# Patient Record
Sex: Female | Born: 1955
Health system: Southern US, Community
[De-identification: ages and names within clinical notes are randomized; demographics above are authoritative.]

## PROBLEM LIST (undated history)

## (undated) DIAGNOSIS — E785 Hyperlipidemia, unspecified: Secondary | ICD-10-CM

## (undated) DIAGNOSIS — F419 Anxiety disorder, unspecified: Secondary | ICD-10-CM

## (undated) DIAGNOSIS — I10 Essential (primary) hypertension: Secondary | ICD-10-CM

## (undated) DIAGNOSIS — F4321 Adjustment disorder with depressed mood: Secondary | ICD-10-CM

## (undated) DIAGNOSIS — K5732 Diverticulitis of large intestine without perforation or abscess without bleeding: Secondary | ICD-10-CM

## (undated) HISTORY — PX: BREAST SURGERY: SHX581

## (undated) HISTORY — DX: Diverticulitis of large intestine without perforation or abscess without bleeding: K57.32

## (undated) HISTORY — DX: Hyperlipidemia, unspecified: E78.5

## (undated) HISTORY — DX: Adjustment disorder with depressed mood: F43.21

## (undated) HISTORY — DX: Essential (primary) hypertension: I10

## (undated) HISTORY — PX: TUBAL LIGATION: SHX77

## (undated) HISTORY — DX: Anxiety disorder, unspecified: F41.9

## (undated) HISTORY — PX: JOINT REPLACEMENT: SHX530

## (undated) HISTORY — PX: BUNIONECTOMY: SHX129

---

## 2004-01-29 ENCOUNTER — Ambulatory Visit (HOSPITAL_COMMUNITY): Admission: RE | Admit: 2004-01-29 | Discharge: 2004-01-29 | Payer: Self-pay | Admitting: Family Medicine

## 2004-02-02 ENCOUNTER — Ambulatory Visit (HOSPITAL_COMMUNITY): Admission: RE | Admit: 2004-02-02 | Discharge: 2004-02-02 | Payer: Self-pay | Admitting: Family Medicine

## 2008-09-02 ENCOUNTER — Ambulatory Visit (HOSPITAL_COMMUNITY): Admission: RE | Admit: 2008-09-02 | Discharge: 2008-09-02 | Payer: Self-pay | Admitting: Family Medicine

## 2008-09-02 IMAGING — CR DG CHEST 2V
2 series · 2 of 2 positions shown · non-contrast
Comparison: None available.

CLINICAL DATA: Cough.

CHEST - 2 VIEW

[view not recorded (1 of 2)]
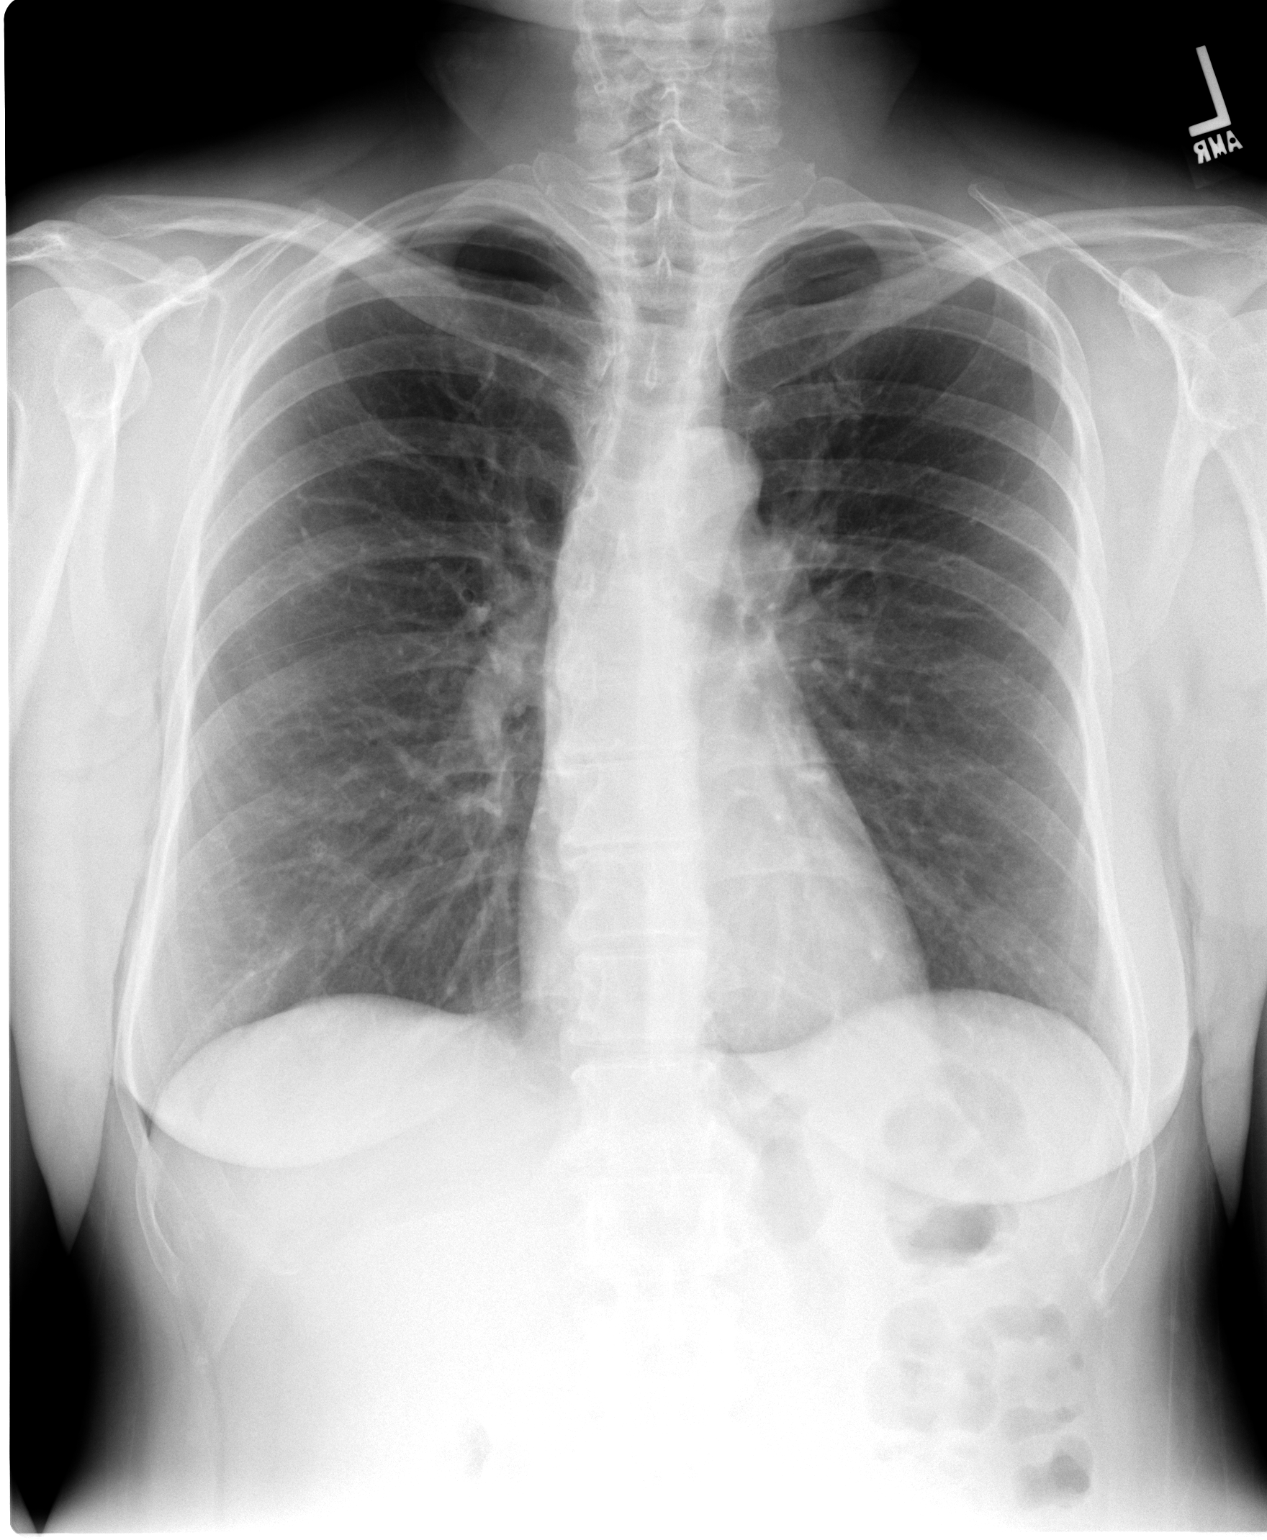

[view not recorded (2 of 2)]
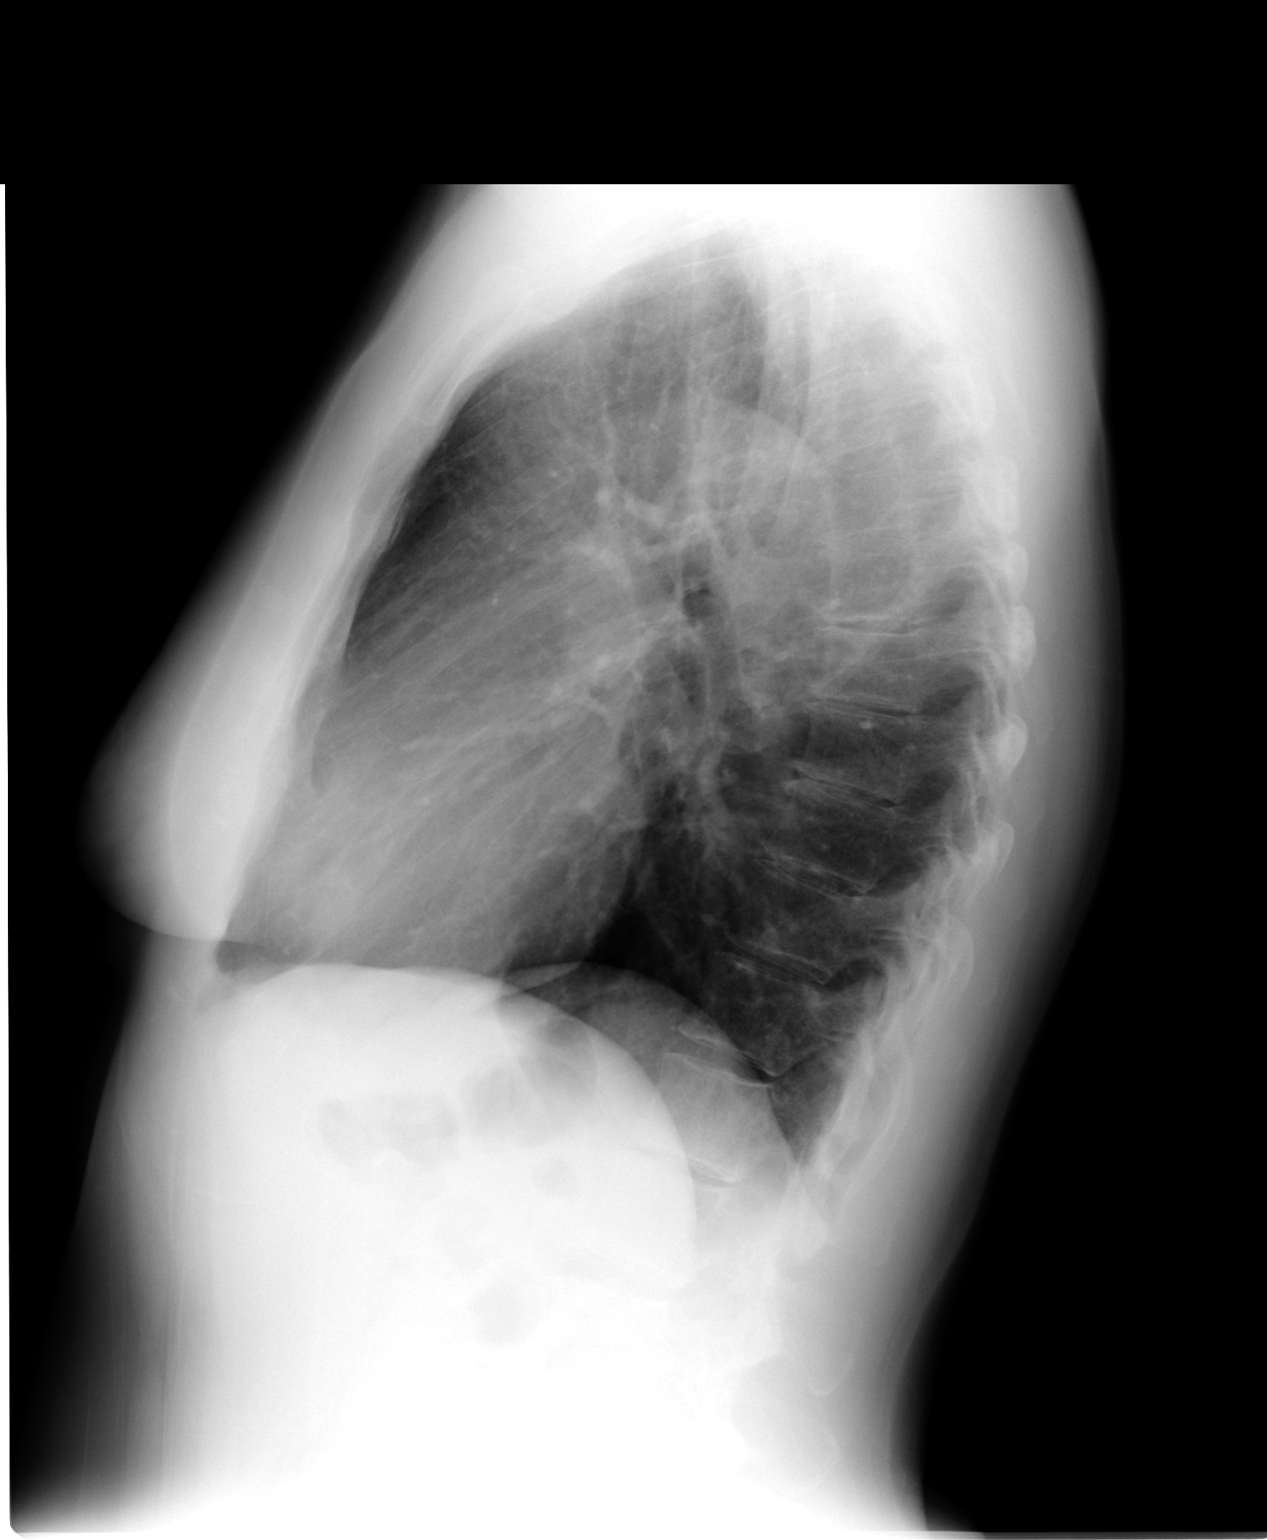

[2 of 2 positions shown; findings below may reference images not displayed]

FINDINGS: The lungs are clear.  No pleural effusion.  Heart size
normal.
IMPRESSION: No acute disease.

## 2008-09-09 ENCOUNTER — Encounter (INDEPENDENT_AMBULATORY_CARE_PROVIDER_SITE_OTHER): Payer: Self-pay | Admitting: *Deleted

## 2008-10-03 ENCOUNTER — Encounter: Payer: Self-pay | Admitting: Internal Medicine

## 2008-10-20 ENCOUNTER — Ambulatory Visit: Payer: Self-pay | Admitting: Internal Medicine

## 2008-10-20 ENCOUNTER — Encounter: Payer: Self-pay | Admitting: Internal Medicine

## 2008-10-20 ENCOUNTER — Ambulatory Visit (HOSPITAL_COMMUNITY): Admission: RE | Admit: 2008-10-20 | Discharge: 2008-10-20 | Payer: Self-pay | Admitting: Internal Medicine

## 2008-10-21 ENCOUNTER — Encounter: Payer: Self-pay | Admitting: Internal Medicine

## 2008-10-22 ENCOUNTER — Encounter: Payer: Self-pay | Admitting: Internal Medicine

## 2010-06-27 ENCOUNTER — Encounter: Payer: Self-pay | Admitting: Family Medicine

## 2010-10-19 NOTE — Op Note (Signed)
Margaret York, Margaret York                  ACCOUNT NO.:  [id number]   MEDICAL RECORD NO.:  [id number]          PATIENT TYPE:  AMB   LOCATION:  DAY                           FACILITY:  [facility]   PHYSICIAN:  R. Roetta Sessions, M.D. DATE OF BIRTH:  1956/03/09   DATE OF PROCEDURE:  10/20/2008  DATE OF DISCHARGE:                               OPERATIVE REPORT   PROCEDURE:  Colonoscopy and snare polypectomy with polyp ablation.   INDICATIONS FOR PROCEDURE:  A 55 year old lady with a positive family  history of colon cancer in father who was diagnosed around age 4.  She  never had her lower GI tract imaged.  She is here for first ever  screening colonoscopy.  She has no lower GI tract symptoms.  Colonoscopy  is now being done as screening maneuver.  Risks, benefits, alternatives  and limitations have been discussed, questions answered.  Please see  documentation in the medical record.   PROCEDURE NOTE:  O2 saturation, blood pressure, pulse on this patient  monitored throughout the entire procedure.  Conscious sedation with  Versed 5 mg IV and Demerol 100 mg IV in divided doses.  Instrument  Pentax video chip system.   FINDINGS:  Digital rectal exam revealed no abnormalities.  Endoscopic  findings:  Prep was adequate:  Colonic mucosal was surveyed from the  rectosigmoid junction through the left transverse, right colon, to the  appendiceal orifice, ileocecal valve and cecum.  These structures well  seen and photographed for the record.  From this level scope was slowly  cautiously withdrawn.  All previous mentioned mucosal surfaces were  again seen.  The patient was noted to have scattered left-sided  diverticula.  The patient had a 5-mm polyp in the distal sigmoid which  was hot snared and recovered through the scope.  There were adjacent  diminutive polyps which were ablated with the tip of hot snare cautery  unit.  The remainder of the colonic mucosa appeared normal.  Scope was  pulled down the  rectum where thorough examination of rectal mucosa  including retroflexion of anal verge demonstrated anal papilla and a  second 5-mm polyp at 5 cm which was removed with hot snare and there  were adjacent diminutive polyps which were ablated as well.  The  remainder of the rectal mucosa appeared unremarkable.  The patient  tolerated the procedure well and was reactivated in endoscopy.   IMPRESSION:  1. Anal papilla, polyps in the sigmoid and rectum resected with snare      as described above, diminutive polyps in rectum and sigmoid ablated      as described above.  2. Left-sided diverticula.  Remaining colonic mucosa appeared normal.   RECOMMENDATIONS:  1. No aspirin or __________ medications for the next 5 days.  2. Follow up diverticulosis polyp literature provided to Ms. Ronne Binning.  3. Follow-up on path.  4. Further recommendations to follow.      Jonathon Bellows, M.D.  Electronically Signed     RMR/MEDQ  D:  10/20/2008  T:  10/20/2008  Job:  [id number]   cc:  Bonne Dolores, M.D.  Fax: [phone]

## 2010-11-24 ENCOUNTER — Observation Stay (HOSPITAL_COMMUNITY)
Admission: EM | Admit: 2010-11-24 | Discharge: 2010-11-26 | Disposition: A | Discharge status: Other Acute Inpatient Hospital | Payer: Managed Care, Other (non HMO) | Attending: Cardiology | Admitting: Cardiology

## 2010-11-24 ENCOUNTER — Emergency Department (HOSPITAL_COMMUNITY): Payer: Managed Care, Other (non HMO)

## 2010-11-24 ENCOUNTER — Emergency Department (HOSPITAL_COMMUNITY)
Admission: EM | Admit: 2010-11-24 | Discharge: 2010-11-24 | Disposition: A | Discharge status: Other Acute Inpatient Hospital | Payer: Managed Care, Other (non HMO) | Source: Home / Self Care | Attending: Emergency Medicine | Admitting: Emergency Medicine

## 2010-11-24 DIAGNOSIS — R072 Precordial pain: Principal | ICD-10-CM | POA: Insufficient documentation

## 2010-11-24 DIAGNOSIS — E785 Hyperlipidemia, unspecified: Secondary | ICD-10-CM | POA: Insufficient documentation

## 2010-11-24 DIAGNOSIS — R509 Fever, unspecified: Secondary | ICD-10-CM | POA: Insufficient documentation

## 2010-11-24 DIAGNOSIS — R079 Chest pain, unspecified: Secondary | ICD-10-CM | POA: Insufficient documentation

## 2010-11-24 DIAGNOSIS — F17200 Nicotine dependence, unspecified, uncomplicated: Secondary | ICD-10-CM | POA: Insufficient documentation

## 2010-11-24 LAB — COMPREHENSIVE METABOLIC PANEL
ALT: 14 U/L (ref 0–35)
Albumin: 4 g/dL (ref 3.5–5.2)
Alkaline Phosphatase: 68 U/L (ref 39–117)
BUN: 15 mg/dL (ref 6–23)
CO2: 29 mEq/L (ref 19–32)
Chloride: 99 mEq/L (ref 96–112)
GFR calc Af Amer: >60 mL/min (ref >60)
GFR calc non Af Amer: >60 mL/min (ref >60)

## 2010-11-24 LAB — CBC
HCT: 34.6 % — ABNORMAL LOW (ref 36.0–46.0)
MCH: 32.3 pg (ref 26.0–34.0)
MCHC: 34.7 g/dL (ref 30.0–36.0)
RBC: 3.71 MIL/uL — ABNORMAL LOW (ref 3.87–5.11)
RDW: 12.6 % (ref 11.5–15.5)

## 2010-11-24 LAB — CK TOTAL AND CKMB (NOT AT ARMC): Relative Index: 1.9 (ref 0.0–2.5)

## 2010-11-25 LAB — LIPID PANEL
Cholesterol: 199 mg/dL (ref 0–200)
Total CHOL/HDL Ratio: 6.4 RATIO
VLDL: 29 mg/dL (ref 0–40)

## 2010-11-25 LAB — MAGNESIUM: Magnesium: 1.9 mg/dL (ref 1.5–2.5)

## 2010-11-25 LAB — CARDIAC PANEL(CRET KIN+CKTOT+MB+TROPI)
Relative Index: 2.2 (ref 0.0–2.5)
Relative Index: 2.3 (ref 0.0–2.5)
Total CK: 100 U/L (ref 7–177)

## 2010-11-25 LAB — T4, FREE: Free T4: 1.06 ng/dL (ref 0.80–1.80)

## 2010-11-25 LAB — HEMOGLOBIN A1C
Hgb A1c MFr Bld: 5.8 % — ABNORMAL HIGH (ref <5.7)
Mean Plasma Glucose: 120 mg/dL — ABNORMAL HIGH (ref <117)

## 2010-11-26 ENCOUNTER — Inpatient Hospital Stay (HOSPITAL_COMMUNITY): Payer: Managed Care, Other (non HMO)

## 2010-11-26 ENCOUNTER — Other Ambulatory Visit (HOSPITAL_COMMUNITY): Payer: Managed Care, Other (non HMO)

## 2010-11-26 MED ORDER — TECHNETIUM TC 99M TETROFOSMIN IV KIT
10.0000 | PACK | Freq: Once | INTRAVENOUS | Status: AC | PRN
  Administered 2010-11-26: 10 via INTRAVENOUS

## 2010-11-26 MED ORDER — TECHNETIUM TC 99M TETROFOSMIN IV KIT
30.0000 | PACK | Freq: Once | INTRAVENOUS | Status: AC | PRN
  Administered 2010-11-26: 30 via INTRAVENOUS

## 2010-11-27 ENCOUNTER — Other Ambulatory Visit (HOSPITAL_COMMUNITY): Payer: Managed Care, Other (non HMO)

## 2010-12-02 NOTE — Discharge Summary (Signed)
Margaret York, Margaret York NO.:  [id number]  MEDICAL RECORD NO.:  [id number]  LOCATION:  4706                         FACILITY:  [facility]  PHYSICIAN:  Landry Corporal, MD DATE OF BIRTH:  [birth year]  DATE OF ADMISSION:  11/24/2010 DATE OF DISCHARGE:  11/26/2010                              DISCHARGE SUMMARY   DISCHARGE DIAGNOSES: 1. Chest pain.  Cardiac enzymes negative x3, negative nuclear stress     test at this admission. 2. Tobacco abuse. 3. Dyslipidemia. 4. Positive family history of coronary artery disease.  HOSPITAL COURSE:  Margaret York is a 56 year old Caucasian female who initially presents to her PCP with persistent nonexertional mid sternal chest pain of 2 weeks duration.  Chest pain was exacerbated at times with mental stress and relieved with rest and relaxation.  She was admitted to rule out acute coronary artery syndrome, 2-D echocardiogram was ordered.  Cardiac enzymes were cycled x3.  Myoview stress test was ordered as well as lipid panel, hemoglobin A1c TSH.  The patient was started on nitroglycerin patch as well as Lovenox.  On the subsequent day, she had no complaints of chest pain or shortness of breath. Cardiac enzymes were negative x3.  She was given Tylenol for headache. On November 26, 2010, she complains of sharp little chest pain.  Nuclear stress test was completed, was negative for inducible ischemia.  A 2-D echocardiogram showed ejection fraction in the range of 65% to 70%, normal wall motion, no loss of wall motion abnormalities and left ventricle, mild LVH, normal cavity size.  Pulmonary artery pressure. The patient has been seen by Dr. Herbie Baltimore to see if she is stable for discharge home and follow up with Dr. Herbie Baltimore.  DISCHARGE LABS:  WBC 7.8, hemoglobin 12.0, hematocrit 34.6, platelets 228.  Pro-time is 13.5, INR is 1.01.  Sodium 135, potassium 3.9, chloride 99, carbon dioxide 29, glucose 85, BUN 15, creatinine 0.62, total  bilirubin 0.6, alkaline phosphatase 68, AST 18, ALT 14, total protein 6.6, albumin 4.0, calcium 9.4, magnesium 9.1, hemoglobin A1c is 5.8, total cholesterol 199, triglycerides 147, HDL 31, LDL 139, VLDL 29, total cholesterol HDL ratio is 6.4, free T4 is 1.06.  Thyroid stimulating hormone of 1.487.  BNP was 85.4.  STUDIES/PROCEDURES: 1. A 2-D echocardiogram, left ventricle cavity size is normal, wall     thickness was increased with mild LVH, no wall motion     abnormalities, ejection fraction 65% to 70%.  Doppler parameters     consistent with high ventricular filling pressure.  Trivial     regurgitation in the aortic valve.  Normal pulmonary artery     pressure. 2. Nuclear stress test, impression normal nuclear medicine cardiac     perfusion scan.  Ejection fraction of 71%, normal wall motion noted     evidence of reversibility or significant scar. 3. Chest x-ray on November 24, 2010 shows no acute cardiopulmonary     abnormality.  Cardiac size within normal limits.  DISCHARGE MEDICATIONS: 1. Acetaminophen 325 mg 2 tablets by mouth every 6 hours as needed for     pain. 2. Maalox suspension 15 to 30 mL, recharge as needed. 3. Nitroglycerin sublingual 0.4 mg 1 tablet  on tongue every 5 minutes     of three doses total for chest pain. 4. Pantoprazole 40 mg enteric-coated 1 tablet by mouth daily. 5. Rosuvastatin 20 mg 1 tablet by mouth daily. 6. Aspirin enteric coated 81 mg one tab by mouth daily. 7. Biotin 1000 mcg 1 tablet by mouth twice daily. 8. Multivitamin 1 tablet by mouth daily. 9. Omega-3 acid 1 g 2 tablets by mouth twice a day. 10.St. John's Wort 300 mg 2 tablets mouth twice daily. 11.B complex over-the-counter 1 tablet by mouth daily.  DISPOSITION:  The patient is discharged home in stable condition.  I recommend she eat heart-healthy diet and is recommended she will followup with Dr. Phillips Odor.    ______________________________ Wilburt Finlay,  PA   ______________________________ Landry Corporal, MD    BH/MEDQ  D:  11/26/2010  T:  11/26/2010  Job:  [id number]  Electronically Signed by Wilburt Finlay PA on 11/29/2010 11:57:33 AM Electronically Signed by Bryan Lemma MD on 12/02/2010 07:59:29 PM

## 2013-04-17 ENCOUNTER — Other Ambulatory Visit (HOSPITAL_COMMUNITY): Payer: Self-pay | Admitting: Family Medicine

## 2013-04-17 ENCOUNTER — Ambulatory Visit (HOSPITAL_COMMUNITY)
Admission: RE | Admit: 2013-04-17 | Discharge: 2013-04-17 | Disposition: A | Discharge status: Home / Self Care | Payer: Managed Care, Other (non HMO) | Source: Ambulatory Visit | Attending: Family Medicine | Admitting: Family Medicine

## 2013-04-17 DIAGNOSIS — F17200 Nicotine dependence, unspecified, uncomplicated: Secondary | ICD-10-CM

## 2013-04-17 DIAGNOSIS — Z0000 Encounter for general adult medical examination without abnormal findings: Secondary | ICD-10-CM

## 2013-04-17 DIAGNOSIS — R05 Cough: Secondary | ICD-10-CM

## 2013-04-17 DIAGNOSIS — Z139 Encounter for screening, unspecified: Secondary | ICD-10-CM

## 2013-04-17 DIAGNOSIS — R059 Cough, unspecified: Secondary | ICD-10-CM

## 2013-04-17 DIAGNOSIS — Z87891 Personal history of nicotine dependence: Secondary | ICD-10-CM | POA: Insufficient documentation

## 2013-04-17 DIAGNOSIS — E785 Hyperlipidemia, unspecified: Secondary | ICD-10-CM

## 2013-04-19 ENCOUNTER — Ambulatory Visit (HOSPITAL_COMMUNITY)
Admission: RE | Admit: 2013-04-19 | Discharge: 2013-04-19 | Disposition: A | Discharge status: Home / Self Care | Payer: Managed Care, Other (non HMO) | Source: Ambulatory Visit | Attending: Family Medicine | Admitting: Family Medicine

## 2013-04-19 DIAGNOSIS — Z780 Asymptomatic menopausal state: Secondary | ICD-10-CM | POA: Insufficient documentation

## 2013-04-19 DIAGNOSIS — Z0000 Encounter for general adult medical examination without abnormal findings: Secondary | ICD-10-CM

## 2013-04-19 DIAGNOSIS — M899 Disorder of bone, unspecified: Secondary | ICD-10-CM | POA: Insufficient documentation

## 2013-04-19 DIAGNOSIS — Z139 Encounter for screening, unspecified: Secondary | ICD-10-CM

## 2020-01-28 DIAGNOSIS — Z6829 Body mass index (BMI) 29.0-29.9, adult: Secondary | ICD-10-CM | POA: Diagnosis not present

## 2020-01-28 DIAGNOSIS — M25562 Pain in left knee: Secondary | ICD-10-CM | POA: Diagnosis not present

## 2020-01-28 DIAGNOSIS — W19XXXA Unspecified fall, initial encounter: Secondary | ICD-10-CM | POA: Diagnosis not present

## 2020-01-28 DIAGNOSIS — S82832A Other fracture of upper and lower end of left fibula, initial encounter for closed fracture: Secondary | ICD-10-CM | POA: Diagnosis not present

## 2020-01-30 DIAGNOSIS — S82832A Other fracture of upper and lower end of left fibula, initial encounter for closed fracture: Secondary | ICD-10-CM | POA: Diagnosis not present

## 2020-01-30 DIAGNOSIS — Z6829 Body mass index (BMI) 29.0-29.9, adult: Secondary | ICD-10-CM | POA: Diagnosis not present

## 2020-02-19 DIAGNOSIS — Z6830 Body mass index (BMI) 30.0-30.9, adult: Secondary | ICD-10-CM | POA: Diagnosis not present

## 2020-02-19 DIAGNOSIS — Z1389 Encounter for screening for other disorder: Secondary | ICD-10-CM | POA: Diagnosis not present

## 2020-02-19 DIAGNOSIS — Z0000 Encounter for general adult medical examination without abnormal findings: Secondary | ICD-10-CM | POA: Diagnosis not present

## 2020-02-19 DIAGNOSIS — E6609 Other obesity due to excess calories: Secondary | ICD-10-CM | POA: Diagnosis not present

## 2020-06-29 DIAGNOSIS — K047 Periapical abscess without sinus: Secondary | ICD-10-CM | POA: Diagnosis not present

## 2020-07-28 DIAGNOSIS — Z6828 Body mass index (BMI) 28.0-28.9, adult: Secondary | ICD-10-CM | POA: Diagnosis not present

## 2020-07-28 DIAGNOSIS — Z01419 Encounter for gynecological examination (general) (routine) without abnormal findings: Secondary | ICD-10-CM | POA: Diagnosis not present

## 2020-10-01 DIAGNOSIS — H01004 Unspecified blepharitis left upper eyelid: Secondary | ICD-10-CM | POA: Diagnosis not present

## 2020-10-01 DIAGNOSIS — H2513 Age-related nuclear cataract, bilateral: Secondary | ICD-10-CM | POA: Diagnosis not present

## 2020-10-01 DIAGNOSIS — H01001 Unspecified blepharitis right upper eyelid: Secondary | ICD-10-CM | POA: Diagnosis not present

## 2020-10-01 DIAGNOSIS — H01002 Unspecified blepharitis right lower eyelid: Secondary | ICD-10-CM | POA: Diagnosis not present

## 2020-10-22 DIAGNOSIS — Z1231 Encounter for screening mammogram for malignant neoplasm of breast: Secondary | ICD-10-CM | POA: Diagnosis not present

## 2020-11-03 ENCOUNTER — Encounter (HOSPITAL_COMMUNITY)
Admission: RE | Admit: 2020-11-03 | Discharge: 2020-11-03 | Disposition: A | Discharge status: Home / Self Care | Payer: Medicare Other | Source: Ambulatory Visit | Attending: Ophthalmology | Admitting: Ophthalmology

## 2020-11-03 ENCOUNTER — Encounter (HOSPITAL_COMMUNITY): Payer: Self-pay

## 2020-11-03 ENCOUNTER — Other Ambulatory Visit: Payer: Self-pay

## 2020-11-03 NOTE — H&P (Signed)
Surgical History & Physical  Patient Name: Margaret York DOB: 1956-03-04  Surgery: Cataract extraction with intraocular lens implant phacoemulsification; Left Eye  Surgeon: Margaret Pierce MD Surgery Date:  11/09/2020 Pre-Op Date:  11/03/2020  HPI: [name] saw Dr Margaret York, was told she had cataracts. 1. The patient complains of difficulty when driving, which began many years ago. Both eyes are affected. The episode is gradual. The condition's severity increased since last visit. Symptoms occur when the patient is driving, inside and outside. This is negatively affecting her quality of life. HPI Completed by Dr. Fabio York  Medical History: Cataracts Seasonal Allergies  Review of Systems Negative Allergic/Immunologic Negative Cardiovascular Negative Constitutional Negative Ear, Nose, Mouth & Throat Negative Endocrine Negative Eyes Negative Gastrointestinal Negative Genitourinary Negative Hemotologic/Lymphatic Negative Integumentary Negative Musculoskeletal Negative Neurological Negative Psychiatry Negative Respiratory  Social   Never smoked   Medication Vitamins, Aspirin, Collagen+Vit C, Fish Oil,   Sx/Procedures Toe Joint Surgery, Tubal Ligation, Breast cyst removals,   Drug Allergies  BACTRIM, Codeine, Zyban,   History & Physical: Heent:  Cataract, Left eye NECK: supple without bruits LUNGS: lungs clear to auscultation CV: regular rate and rhythm Abdomen: soft and non-tender  Impression & Plan: Assessment: 1.  NUCLEAR SCLEROSIS AGE RELATED; Both Eyes (H25.13) 2.  BLEPHARITIS; Right Upper Lid, Right Lower Lid, Left Upper Lid, Left Lower Lid (H01.001, H01.002,H01.004,H01.005) 3.  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  OAG BORDERLINE FINDINGS LOW RISK; Both Eyes (H40.013) 5.  ASTIGMATISM, REGULAR; Both Eyes (H52.223)  Plan: 1.  Cataract accounts for the patient's decreased vision. This visual impairment is not  correctable with a tolerable change in glasses or contact lenses. Cataract surgery with an implantation of a new lens should significantly improve the visual and functional status of the patient. Discussed all risks, benefits, alternatives, and potential complications. Discussed the procedures and recovery. Patient desires to have surgery. A-scan ordered and performed today for intra-ocular lens calculations. The surgery will be performed in order to improve vision for driving, reading, and for eye examinations. Recommend phacoemulsification with intra-ocular lens. Recommend Dextenza for post-operative pain and inflammation. Left Eye worse - first. Dilates well - shugarcaine by protocol. Toric Lens. 2.  Recommend regular lid cleaning. 3.  Chronic. Artificial tears 1 drop 2-3x/day. 4.  Based on cup-to-disc ratio. Positive Family history. OCT rNFL shows: WNL OU. Detailed discussion about glaucoma today including importance of maintaining good follow up and following treatment plan, and the possibility of irreversible blindness as part of this disease process. Positive family history. 5.  Recommend Toric IOL as above.

## 2020-11-06 ENCOUNTER — Other Ambulatory Visit (HOSPITAL_COMMUNITY): Payer: Self-pay

## 2020-11-09 NOTE — OR Nursing (Signed)
Procedure canceled ,  Dr. Beather Arbour sick, patient notified

## 2020-12-25 MED ORDER — TETRACAINE HCL 0.5 % OP SOLN: 1.0000 [drp] | OPHTHALMIC | Status: DC | PRN

## 2020-12-25 MED ORDER — PHENYLEPHRINE HCL 2.5 % OP SOLN: 1.0000 [drp] | OPHTHALMIC | Status: DC | PRN

## 2020-12-28 ENCOUNTER — Encounter (HOSPITAL_COMMUNITY)
Admission: RE | Admit: 2020-12-28 | Discharge: 2020-12-28 | Disposition: A | Discharge status: Home / Self Care | Payer: Medicare Other | Source: Ambulatory Visit | Attending: Ophthalmology | Admitting: Ophthalmology

## 2020-12-28 ENCOUNTER — Encounter (HOSPITAL_COMMUNITY): Payer: Self-pay

## 2020-12-28 ENCOUNTER — Other Ambulatory Visit: Payer: Self-pay

## 2020-12-28 DIAGNOSIS — H2512 Age-related nuclear cataract, left eye: Secondary | ICD-10-CM | POA: Diagnosis not present

## 2020-12-28 NOTE — H&P (Signed)
Surgical History & Physical  Patient Name: Margaret York DOB: 09-07-1955  Surgery: Cataract extraction with intraocular lens implant phacoemulsification; Left Eye  Surgeon: Margaret Pierce MD Surgery Date:  01/01/2021 Pre-Op Date:  12/28/2020  HPI: [name] saw Dr Margaret York, was told she had cataracts. 1. The patient complains of difficulty when driving, which began many years ago. Both eyes are affected. The episode is gradual. The condition's severity increased since last visit. Symptoms occur when the patient is driving, inside and outside. This is negatively affecting her quality of life. HPI Completed by Dr. Fabio York  Medical History: Cataracts Seasonal Allergies  Review of Systems Negative Allergic/Immunologic Negative Cardiovascular Negative Constitutional Negative Ear, Nose, Mouth & Throat Negative Endocrine Negative Eyes Negative Gastrointestinal Negative Genitourinary Negative Hemotologic/Lymphatic Negative Integumentary Negative Musculoskeletal Negative Neurological Negative Psychiatry Negative Respiratory  Social   Never smoked   Medication Vitamins, Aspirin, Collagen+Vit C, Fish Oil,   Sx/Procedures Toe Joint Surgery, Tubal Ligation, Breast cyst removals,   Drug Allergies  BACTRIM, Codeine, Zyban,   History & Physical: Heent:  Cataract, Left eye NECK: supple without bruits LUNGS: lungs clear to auscultation CV: regular rate and rhythm Abdomen: soft and non-tender  Impression & Plan: Assessment: 1.  NUCLEAR SCLEROSIS AGE RELATED; Both Eyes (H25.13) 2.  BLEPHARITIS; Right Upper Lid, Right Lower Lid, Left Upper Lid, Left Lower Lid (H01.001, H01.002,H01.004,H01.005) 3.  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  OAG BORDERLINE FINDINGS LOW RISK; Both Eyes (H40.013) 5.  ASTIGMATISM, REGULAR; Both Eyes (H52.223)  Plan: 1.  Cataract accounts for the patient's decreased vision. This visual impairment is not  correctable with a tolerable change in glasses or contact lenses. Cataract surgery with an implantation of a new lens should significantly improve the visual and functional status of the patient. Discussed all risks, benefits, alternatives, and potential complications. Discussed the procedures and recovery. Patient desires to have surgery. A-scan ordered and performed today for intra-ocular lens calculations. The surgery will be performed in order to improve vision for driving, reading, and for eye examinations. Recommend phacoemulsification with intra-ocular lens. Recommend Dextenza for post-operative pain and inflammation. Left Eye worse - first. Dilates well - shugarcaine by protocol. Toric Lens.  2.  Recommend regular lid cleaning.  3.  Chronic. Artificial tears 1 drop 2-3x/day.  4.  Based on cup-to-disc ratio. Positive Family history. OCT rNFL shows: WNL OU. Detailed discussion about glaucoma today including importance of maintaining good follow up and following treatment plan, and the possibility of irreversible blindness as part of this disease process. Positive family history.  5.  Recommend Toric IOL as above.

## 2021-01-01 ENCOUNTER — Encounter (HOSPITAL_COMMUNITY): Payer: Self-pay | Admitting: Ophthalmology

## 2021-01-01 ENCOUNTER — Ambulatory Visit (HOSPITAL_COMMUNITY): Payer: Medicare Other | Admitting: Anesthesiology

## 2021-01-01 ENCOUNTER — Ambulatory Visit (HOSPITAL_COMMUNITY)
Admission: RE | Admit: 2021-01-01 | Discharge: 2021-01-01 | Disposition: A | Discharge status: Home / Self Care | Payer: Medicare Other | Attending: Ophthalmology | Admitting: Ophthalmology

## 2021-01-01 ENCOUNTER — Encounter (HOSPITAL_COMMUNITY)
Admission: RE | Disposition: A | Discharge status: Home / Self Care | Payer: Self-pay | Source: Home / Self Care | Attending: Ophthalmology

## 2021-01-01 DIAGNOSIS — H52223 Regular astigmatism, bilateral: Secondary | ICD-10-CM | POA: Insufficient documentation

## 2021-01-01 DIAGNOSIS — H2512 Age-related nuclear cataract, left eye: Secondary | ICD-10-CM | POA: Diagnosis not present

## 2021-01-01 DIAGNOSIS — Z885 Allergy status to narcotic agent status: Secondary | ICD-10-CM | POA: Insufficient documentation

## 2021-01-01 DIAGNOSIS — Z881 Allergy status to other antibiotic agents status: Secondary | ICD-10-CM | POA: Diagnosis not present

## 2021-01-01 DIAGNOSIS — F17210 Nicotine dependence, cigarettes, uncomplicated: Secondary | ICD-10-CM | POA: Diagnosis not present

## 2021-01-01 HISTORY — PX: CATARACT EXTRACTION W/PHACO: SHX586

## 2021-01-01 SURGERY — PHACOEMULSIFICATION, CATARACT, WITH IOL INSERTION
Anesthesia: Monitor Anesthesia Care | Site: Eye | Laterality: Left

## 2021-01-01 MED ORDER — EPINEPHRINE PF 1 MG/ML IJ SOLN
INTRAOCULAR | Status: DC | PRN
  Administered 2021-01-01: 500 mL

## 2021-01-01 MED ORDER — SODIUM HYALURONATE 10 MG/ML IO SOLUTION
PREFILLED_SYRINGE | INTRAOCULAR | Status: DC | PRN
  Administered 2021-01-01: 0.85 mL via INTRAOCULAR

## 2021-01-01 MED ORDER — POVIDONE-IODINE 5 % OP SOLN
OPHTHALMIC | Status: DC | PRN
  Administered 2021-01-01: 1 via OPHTHALMIC

## 2021-01-01 MED ORDER — TETRACAINE HCL 0.5 % OP SOLN
1.0000 [drp] | OPHTHALMIC | Status: AC | PRN
  Administered 2021-01-01 (×3): 1 [drp] via OPHTHALMIC

## 2021-01-01 MED ORDER — STERILE WATER FOR IRRIGATION IR SOLN
Status: DC | PRN
  Administered 2021-01-01: 250 mL

## 2021-01-01 MED ORDER — TROPICAMIDE 1 % OP SOLN
1.0000 [drp] | OPHTHALMIC | Status: AC
  Administered 2021-01-01 (×3): 1 [drp] via OPHTHALMIC

## 2021-01-01 MED ORDER — LIDOCAINE HCL (PF) 1 % IJ SOLN
INTRAOCULAR | Status: DC | PRN
  Administered 2021-01-01: 1 mL via OPHTHALMIC

## 2021-01-01 MED ORDER — PHENYLEPHRINE HCL 2.5 % OP SOLN
1.0000 [drp] | OPHTHALMIC | Status: AC | PRN
  Administered 2021-01-01 (×3): 1 [drp] via OPHTHALMIC

## 2021-01-01 MED ORDER — LIDOCAINE HCL 3.5 % OP GEL
1.0000 "application " | Freq: Once | OPHTHALMIC | Status: AC
  Administered 2021-01-01: 1 via OPHTHALMIC

## 2021-01-01 MED ORDER — BSS IO SOLN
INTRAOCULAR | Status: DC | PRN
  Administered 2021-01-01: 15 mL via INTRAOCULAR

## 2021-01-01 MED ORDER — SODIUM HYALURONATE 23MG/ML IO SOSY
PREFILLED_SYRINGE | INTRAOCULAR | Status: DC | PRN
  Administered 2021-01-01: 0.6 mL via INTRAOCULAR

## 2021-01-01 SURGICAL SUPPLY — 11 items
CLOTH BEACON ORANGE TIMEOUT ST (SAFETY) ×1 IMPLANT
EYE SHIELD UNIVERSAL CLEAR (GAUZE/BANDAGES/DRESSINGS) ×1 IMPLANT
GLOVE SURG UNDER POLY LF SZ7 (GLOVE) ×2 IMPLANT
NDL HYPO 18GX1.5 BLUNT FILL (NEEDLE) IMPLANT
NEEDLE HYPO 18GX1.5 BLUNT FILL (NEEDLE) ×2 IMPLANT
PAD ARMBOARD 7.5X6 YLW CONV (MISCELLANEOUS) ×1 IMPLANT
SYR TB 1ML LL NO SAFETY (SYRINGE) ×1 IMPLANT
TAPE SURG TRANSPORE 1 IN (GAUZE/BANDAGES/DRESSINGS) IMPLANT
TAPE SURGICAL TRANSPORE 1 IN (GAUZE/BANDAGES/DRESSINGS) ×2
TECHNIS 1-PIECE IOL (Intraocular Lens) ×1 IMPLANT
WATER STERILE IRR 250ML POUR (IV SOLUTION) ×1 IMPLANT

## 2021-01-01 NOTE — Discharge Instructions (Signed)
Please discharge patient when stable, will follow up today with Dr. Mckaela Howley at the [facility] [place] office immediately following discharge.  Leave shield in place until visit.  All paperwork with discharge instructions will be given at the office.  [facility] [place] Address:  [address]  [place], [place] [address]  

## 2021-01-01 NOTE — Op Note (Signed)
Date of procedure: 01/01/21  Pre-operative diagnosis: Visually significant age-related nuclear cataract, Left Eye (H25.12)  Post-operative diagnosis: Visually significant age-related nuclear cataract, Left Eye  Procedure: Removal of cataract via phacoemulsification and insertion of intra-ocular lens Wynetta Emery and [place]  +16.5D into the capsular bag of the Left Eye  Attending surgeon: Gerda Diss. Kortney Potvin, MD, MA  Anesthesia: MAC, Topical Akten  Complications: None  Estimated Blood Loss: <63m (minimal)  Specimens: None  Implants: As above  Indications:  Visually significant age-related cataract, Left Eye  Procedure:  The patient was seen and identified in the pre-operative area. The operative eye was identified and dilated.  The operative eye was marked.  Topical anesthesia was administered to the operative eye.     The patient was then to the operative suite and placed in the supine position.  A timeout was performed confirming the patient, procedure to be performed, and all other relevant information.   The patient's face was prepped and draped in the usual fashion for intra-ocular surgery.  A lid speculum was placed into the operative eye and the surgical microscope moved into place and focused.  An inferotemporal paracentesis was created using a 20 gauge paracentesis blade.  Shugarcaine was injected into the anterior chamber.  Viscoelastic was injected into the anterior chamber.  A temporal clear-corneal main wound incision was created using a 2.473mmicrokeratome.  A continuous curvilinear capsulorrhexis was initiated using an irrigating cystitome and completed using capsulorrhexis forceps.  Hydrodissection and hydrodeliniation were performed.  Viscoelastic was injected into the anterior chamber.  A phacoemulsification handpiece and a chopper as a second instrument were used to remove the nucleus and epinucleus. The irrigation/aspiration handpiece was used to remove any remaining  cortical material.   The capsular bag was reinflated with viscoelastic, checked, and found to be intact.  The intraocular lens was inserted into the capsular bag.  The irrigation/aspiration handpiece was used to remove any remaining viscoelastic.  The clear corneal wound and paracentesis wounds were then hydrated and checked with Weck-Cels to be watertight.  The lid-speculum and drape was removed, and the patient's face was cleaned with a wet and dry 4x4.  Maxitrol was instilled in the eye before a clear shield was taped over the eye. The patient was taken to the post-operative care unit in good condition, having tolerated the procedure well.  Post-Op Instructions: [facility] and will receive all other orders and instructions.

## 2021-01-01 NOTE — Anesthesia Preprocedure Evaluation (Signed)
Anesthesia Evaluation  Patient identified by MRN, date of birth, ID band Patient awake    Reviewed: Allergy & Precautions, H&P , NPO status , Patient's Chart, lab work & pertinent test results, reviewed documented beta blocker date and time   Airway Mallampati: II  TM Distance: >3 FB Neck ROM: full    Dental no notable dental hx.    Pulmonary neg pulmonary ROS, Current Smoker,    Pulmonary exam normal breath sounds clear to auscultation       Cardiovascular Exercise Tolerance: Good negative cardio ROS   Rhythm:regular Rate:Normal     Neuro/Psych negative neurological ROS  negative psych ROS   GI/Hepatic negative GI ROS, Neg liver ROS,   Endo/Other  negative endocrine ROS  Renal/GU negative Renal ROS  negative genitourinary   Musculoskeletal   Abdominal   Peds  Hematology negative hematology ROS (+)   Anesthesia Other Findings   Reproductive/Obstetrics negative OB ROS                             Anesthesia Physical Anesthesia Plan  ASA: 1  Anesthesia Plan: MAC   Post-op Pain Management:    Induction:   PONV Risk Score and Plan:   Airway Management Planned:   Additional Equipment:   Intra-op Plan:   Post-operative Plan:   Informed Consent: I have reviewed the patients History and Physical, chart, labs and discussed the procedure including the risks, benefits and alternatives for the proposed anesthesia with the patient or authorized representative who has indicated his/her understanding and acceptance.     Dental Advisory Given  Plan Discussed with: CRNA  Anesthesia Plan Comments:         Anesthesia Quick Evaluation

## 2021-01-01 NOTE — Anesthesia Postprocedure Evaluation (Signed)
Anesthesia Post Note  Patient: Margaret York  Procedure(s) Performed: CATARACT EXTRACTION PHACO AND INTRAOCULAR LENS PLACEMENT (IOC) (Left: Eye)  Patient location during evaluation: PACU Anesthesia Type: MAC Level of consciousness: awake and alert Pain management: pain level controlled Vital Signs Assessment: post-procedure vital signs reviewed and stable Respiratory status: spontaneous breathing Cardiovascular status: blood pressure returned to baseline and stable Postop Assessment: no apparent nausea or vomiting Anesthetic complications: no   No notable events documented.   Last Vitals:  Vitals:   01/01/21 1214  BP: 140/83  Pulse: 82  Resp: 18  Temp: 37.3 C  SpO2: 97%    Last Pain:  Vitals:   01/01/21 1214  PainSc: 5                  Ambrose Wile

## 2021-01-01 NOTE — Transfer of Care (Signed)
Immediate Anesthesia Transfer of Care Note  Patient: Margaret York  Procedure(s) Performed: CATARACT EXTRACTION PHACO AND INTRAOCULAR LENS PLACEMENT (IOC) (Left: Eye)  Patient Location: Short Stay  Anesthesia Type:MAC  Level of Consciousness: awake  Airway & Oxygen Therapy: Patient Spontanous Breathing  Post-op Assessment: Report given to RN  Post vital signs: Reviewed  Last Vitals:  Vitals Value Taken Time  BP    Temp    Pulse    Resp    SpO2      Last Pain:  Vitals:   01/01/21 1214  PainSc: 5       Patients Stated Pain Goal: 6 (37/62/83 1517)  Complications: No notable events documented.

## 2021-01-01 NOTE — Interval H&P Note (Signed)
History and Physical Interval Note:  01/01/2021 1:19 PM  Margaret York  has presented today for surgery, with the diagnosis of Nuclear sclerotic cataract - Left eye.  The various methods of treatment have been discussed with the patient and family. After consideration of risks, benefits and other options for treatment, the patient has consented to  Procedure(s) with comments: CATARACT EXTRACTION PHACO AND INTRAOCULAR LENS PLACEMENT (IOC) (Left) - left as a surgical intervention.  The patient's history has been reviewed, patient examined, no change in status, stable for surgery.  I have reviewed the patient's chart and labs.  Questions were answered to the patient's satisfaction.     Fabio Pierce

## 2021-01-04 ENCOUNTER — Encounter (HOSPITAL_COMMUNITY): Payer: Self-pay | Admitting: Ophthalmology

## 2021-01-18 DIAGNOSIS — H2511 Age-related nuclear cataract, right eye: Secondary | ICD-10-CM | POA: Diagnosis not present

## 2021-01-20 NOTE — H&P (Signed)
Surgical History & Physical  Patient Name: Margaret York DOB: 1955-07-09  Surgery: Cataract extraction with intraocular lens implant phacoemulsification; Right Eye  Surgeon: Fabio Pierce MD Surgery Date:  01/27/2021 Pre-Op Date:  01/14/2021  HPI: A 59 Yr. old female patient *Patient IS proceeding with OD sx* The patient is returning after cataract surgery. The left eye is affected. Status post cataract surgery, which began 2 weeks ago: Since the last visit, the affected area is doing well. The patient's vision is improved and stable. Patient is following medication instructions. Pt taking PO drops BID OD. Pt denies any increase in floaters/flashes of light or eye pain. Pt states she has spot in central vision that looks like a "dot with strings hanging". Pt thought it was part of cataract but still present. Pt is concerned. Pt states it does move with eye movement. The patient complains of difficulty when driving, which began many years ago. The right eye is affected. The episode is gradual. The condition's severity increased since last visit. Symptoms occur when the patient is driving, inside and outside. This is negatively affecting her quality of life. HPI Completed by Dr. Fabio Pierce  Medical History: Cataracts Seasonal Allergies  Review of Systems Negative Allergic/Immunologic Negative Cardiovascular Negative Constitutional Negative Ear, Nose, Mouth & Throat Negative Endocrine Negative Eyes Negative Gastrointestinal Negative Genitourinary Negative Hemotologic/Lymphatic Negative Integumentary Negative Musculoskeletal Negative Neurological Negative Psychiatry Negative Respiratory  Social   Never smoked   Medication Prednisolone-Moxifloxacin-Bromfenac,  Vitamins, Aspirin, Collagen+Vit C, Fish Oil,   Sx/Procedures Phaco c IOL OS,  Toe Joint Surgery, Tubal Ligation, Breast cyst removals,   Drug Allergies  BACTRIM, Codeine, Zyban,   History & Physical: Heent:   Cataract, Right eye NECK: supple without bruits LUNGS: lungs clear to auscultation CV: regular rate and rhythm Abdomen: soft and non-tender  Impression & Plan: Assessment: 1.  CATARACT EXTRACTION STATUS; Left Eye (Z98.42) 2.  INTRAOCULAR LENS IOL (Z96.1) 3.  NUCLEAR SCLEROSIS AGE RELATED; ,, Right Eye (H25.11) 4.  Presbyopia ; Both Eyes (H52.4)  Plan: 1.  1 week after cataract surgery. Doing well with improved vision and normal eye pressure. Call with any problems or concerns. Continue Pred-Moxi-Brom 2x/day for 3 more weeks.  2.  Doing well since surgery Continue Post-op medications  3.  Cataract accounts for the patient's decreased vision. This visual impairment is not correctable with a tolerable change in glasses or contact lenses. Cataract surgery with an implantation of a new lens should significantly improve the visual and functional status of the patient. Discussed all risks, benefits, alternatives, and potential complications. Discussed the procedures and recovery. Patient desires to have surgery. A-scan ordered and performed today for intra-ocular lens calculations. The surgery will be performed in order to improve vision for driving, reading, and for eye examinations. Recommend phacoemulsification with intra-ocular lens. Recommend Dextenza for post-operative pain and inflammation. Right Eye. Surgery required to correct imbalance of vision. Dilates well - shugarcaine by protocol.

## 2021-01-22 ENCOUNTER — Other Ambulatory Visit: Payer: Self-pay

## 2021-01-22 ENCOUNTER — Encounter (HOSPITAL_COMMUNITY)
Admission: RE | Admit: 2021-01-22 | Discharge: 2021-01-22 | Disposition: A | Discharge status: Home / Self Care | Payer: Medicare Other | Source: Ambulatory Visit | Attending: Ophthalmology | Admitting: Ophthalmology

## 2021-01-27 ENCOUNTER — Encounter (HOSPITAL_COMMUNITY): Payer: Self-pay | Admitting: Ophthalmology

## 2021-01-27 ENCOUNTER — Ambulatory Visit (HOSPITAL_COMMUNITY)
Admission: RE | Admit: 2021-01-27 | Discharge: 2021-01-27 | Disposition: A | Discharge status: Home / Self Care | Payer: Medicare Other | Attending: Ophthalmology | Admitting: Ophthalmology

## 2021-01-27 ENCOUNTER — Ambulatory Visit (HOSPITAL_COMMUNITY): Payer: Medicare Other | Admitting: Anesthesiology

## 2021-01-27 ENCOUNTER — Encounter (HOSPITAL_COMMUNITY)
Admission: RE | Disposition: A | Discharge status: Home / Self Care | Payer: Self-pay | Source: Home / Self Care | Attending: Ophthalmology

## 2021-01-27 DIAGNOSIS — Z9842 Cataract extraction status, left eye: Secondary | ICD-10-CM | POA: Diagnosis not present

## 2021-01-27 DIAGNOSIS — Z885 Allergy status to narcotic agent status: Secondary | ICD-10-CM | POA: Diagnosis not present

## 2021-01-27 DIAGNOSIS — Z961 Presence of intraocular lens: Secondary | ICD-10-CM | POA: Insufficient documentation

## 2021-01-27 DIAGNOSIS — H2511 Age-related nuclear cataract, right eye: Secondary | ICD-10-CM | POA: Diagnosis not present

## 2021-01-27 DIAGNOSIS — F17200 Nicotine dependence, unspecified, uncomplicated: Secondary | ICD-10-CM | POA: Diagnosis not present

## 2021-01-27 DIAGNOSIS — Z881 Allergy status to other antibiotic agents status: Secondary | ICD-10-CM | POA: Diagnosis not present

## 2021-01-27 DIAGNOSIS — Z79899 Other long term (current) drug therapy: Secondary | ICD-10-CM | POA: Diagnosis not present

## 2021-01-27 DIAGNOSIS — H524 Presbyopia: Secondary | ICD-10-CM | POA: Insufficient documentation

## 2021-01-27 HISTORY — PX: CATARACT EXTRACTION W/PHACO: SHX586

## 2021-01-27 SURGERY — PHACOEMULSIFICATION, CATARACT, WITH IOL INSERTION
Anesthesia: Monitor Anesthesia Care | Site: Eye | Laterality: Right

## 2021-01-27 MED ORDER — STERILE WATER FOR IRRIGATION IR SOLN
Status: DC | PRN
  Administered 2021-01-27: 1000 mL

## 2021-01-27 MED ORDER — EPINEPHRINE PF 1 MG/ML IJ SOLN
INTRAMUSCULAR | Status: AC
  Filled 2021-01-27: qty 2

## 2021-01-27 MED ORDER — SODIUM HYALURONATE 23MG/ML IO SOSY
PREFILLED_SYRINGE | INTRAOCULAR | Status: DC | PRN
  Administered 2021-01-27: 0.6 mL via INTRAOCULAR

## 2021-01-27 MED ORDER — TROPICAMIDE 1 % OP SOLN
1.0000 [drp] | OPHTHALMIC | Status: AC
  Administered 2021-01-27 (×3): 1 [drp] via OPHTHALMIC

## 2021-01-27 MED ORDER — EPINEPHRINE PF 1 MG/ML IJ SOLN
INTRAOCULAR | Status: DC | PRN
  Administered 2021-01-27: 500 mL

## 2021-01-27 MED ORDER — PHENYLEPHRINE HCL 2.5 % OP SOLN
1.0000 [drp] | OPHTHALMIC | Status: AC | PRN
  Administered 2021-01-27 (×3): 1 [drp] via OPHTHALMIC

## 2021-01-27 MED ORDER — LIDOCAINE HCL 3.5 % OP GEL
1.0000 "application " | Freq: Once | OPHTHALMIC | Status: AC
  Administered 2021-01-27: 1 via OPHTHALMIC

## 2021-01-27 MED ORDER — SODIUM HYALURONATE 10 MG/ML IO SOLUTION
PREFILLED_SYRINGE | INTRAOCULAR | Status: DC | PRN
  Administered 2021-01-27: 0.85 mL via INTRAOCULAR

## 2021-01-27 MED ORDER — CYCLOPENTOLATE-PHENYLEPHRINE 0.2-1 % OP SOLN: 1.0000 [drp] | OPHTHALMIC | Status: DC | PRN

## 2021-01-27 MED ORDER — LIDOCAINE HCL (PF) 1 % IJ SOLN
INTRAOCULAR | Status: DC | PRN
  Administered 2021-01-27: 1 mL via OPHTHALMIC

## 2021-01-27 MED ORDER — TETRACAINE HCL 0.5 % OP SOLN
1.0000 [drp] | OPHTHALMIC | Status: AC | PRN
  Administered 2021-01-27 (×3): 1 [drp] via OPHTHALMIC

## 2021-01-27 MED ORDER — POVIDONE-IODINE 5 % OP SOLN
OPHTHALMIC | Status: DC | PRN
  Administered 2021-01-27: 1 via OPHTHALMIC

## 2021-01-27 SURGICAL SUPPLY — 12 items
CLOTH BEACON ORANGE TIMEOUT ST (SAFETY) ×1 IMPLANT
EYE SHIELD UNIVERSAL CLEAR (GAUZE/BANDAGES/DRESSINGS) ×1 IMPLANT
GLOVE SURG UNDER POLY LF SZ6.5 (GLOVE) ×1 IMPLANT
GLOVE SURG UNDER POLY LF SZ7 (GLOVE) ×1 IMPLANT
NDL HYPO 18GX1.5 BLUNT FILL (NEEDLE) IMPLANT
NEEDLE HYPO 18GX1.5 BLUNT FILL (NEEDLE) ×2 IMPLANT
PAD ARMBOARD 7.5X6 YLW CONV (MISCELLANEOUS) ×1 IMPLANT
SYR TB 1ML LL NO SAFETY (SYRINGE) ×1 IMPLANT
TAPE SURG TRANSPORE 1 IN (GAUZE/BANDAGES/DRESSINGS) IMPLANT
TAPE SURGICAL TRANSPORE 1 IN (GAUZE/BANDAGES/DRESSINGS) ×2
TECNIS 1-PIECE IOL (Intraocular Lens) ×1 IMPLANT
WATER STERILE IRR 250ML POUR (IV SOLUTION) ×1 IMPLANT

## 2021-01-27 NOTE — Transfer of Care (Signed)
Immediate Anesthesia Transfer of Care Note  Patient: Margaret York  Procedure(s) Performed: CATARACT EXTRACTION PHACO AND INTRAOCULAR LENS PLACEMENT (IOC) (Right: Eye)  Patient Location: Short Stay  Anesthesia Type:MAC  Level of Consciousness: awake  Airway & Oxygen Therapy: Patient Spontanous Breathing  Post-op Assessment: Report given to RN  Post vital signs: Reviewed  Last Vitals:  Vitals Value Taken Time  BP 136/76 01/27/21 1424  Temp 36.3 C 01/27/21 1424  Pulse 82 01/27/21 1424  Resp 18 01/27/21 1424  SpO2 98 % 01/27/21 1424    Last Pain:  Vitals:   01/27/21 1424  TempSrc: Oral  PainSc: 2          Complications: No notable events documented.

## 2021-01-27 NOTE — Discharge Instructions (Signed)
Please discharge patient when stable, will follow up today with Dr. Azha Constantin at the [facility] [place] office immediately following discharge.  Leave shield in place until visit.  All paperwork with discharge instructions will be given at the office.  [facility] Address:  [address]  , [place] [address]  

## 2021-01-27 NOTE — Anesthesia Preprocedure Evaluation (Signed)
Anesthesia Evaluation  Patient identified by MRN, date of birth, ID band Patient awake    Reviewed: Allergy & Precautions, NPO status , Patient's Chart, lab work & pertinent test results  History of Anesthesia Complications Negative for: history of anesthetic complications  Airway Mallampati: II  TM Distance: >3 FB Neck ROM: Full    Dental  (+) Dental Advisory Given, Teeth Intact   Pulmonary Current SmokerPatient did not abstain from smoking.,    Pulmonary exam normal breath sounds clear to auscultation       Cardiovascular Exercise Tolerance: Good Normal cardiovascular exam Rhythm:Regular Rate:Normal     Neuro/Psych negative neurological ROS     GI/Hepatic negative GI ROS, Neg liver ROS,   Endo/Other  negative endocrine ROS  Renal/GU negative Renal ROS     Musculoskeletal negative musculoskeletal ROS (+)   Abdominal   Peds  Hematology negative hematology ROS (+)   Anesthesia Other Findings   Reproductive/Obstetrics negative OB ROS                             Anesthesia Physical Anesthesia Plan  ASA: 2  Anesthesia Plan: MAC   Post-op Pain Management:    Induction:   PONV Risk Score and Plan:   Airway Management Planned: Nasal Cannula and Natural Airway  Additional Equipment:   Intra-op Plan:   Post-operative Plan:   Informed Consent: I have reviewed the patients History and Physical, chart, labs and discussed the procedure including the risks, benefits and alternatives for the proposed anesthesia with the patient or authorized representative who has indicated his/her understanding and acceptance.       Plan Discussed with: CRNA and Surgeon  Anesthesia Plan Comments:         Anesthesia Quick Evaluation

## 2021-01-27 NOTE — Op Note (Signed)
Date of procedure: 01/27/21  Pre-operative diagnosis: Visually significant age-related nuclear cataract, Right Eye (H25.11)  Post-operative diagnosis: Visually significant age-related nuclear cataract, Right Eye  Procedure: Removal of cataract via phacoemulsification and insertion of intra-ocular lens Wynetta Emery and [place]  +17.5D into the capsular bag of the Right Eye  Attending surgeon: Gerda Diss. Arley Garant, MD, MA  Anesthesia: MAC, Topical Akten  Complications: None  Estimated Blood Loss: <69m (minimal)  Specimens: None  Implants: As above  Indications:  Visually significant age-related cataract, Right Eye  Procedure:  The patient was seen and identified in the pre-operative area. The operative eye was identified and dilated.  The operative eye was marked.  Topical anesthesia was administered to the operative eye.     The patient was then to the operative suite and placed in the supine position.  A timeout was performed confirming the patient, procedure to be performed, and all other relevant information.   The patient's face was prepped and draped in the usual fashion for intra-ocular surgery.  A lid speculum was placed into the operative eye and the surgical microscope moved into place and focused.  A superotemporal paracentesis was created using a 20 gauge paracentesis blade.  Shugarcaine was injected into the anterior chamber.  Viscoelastic was injected into the anterior chamber.  A temporal clear-corneal main wound incision was created using a 2.418mmicrokeratome.  A continuous curvilinear capsulorrhexis was initiated using an irrigating cystitome and completed using capsulorrhexis forceps.  Hydrodissection and hydrodeliniation were performed.  Viscoelastic was injected into the anterior chamber.  A phacoemulsification handpiece and a chopper as a second instrument were used to remove the nucleus and epinucleus. The irrigation/aspiration handpiece was used to remove any  remaining cortical material.   The capsular bag was reinflated with viscoelastic, checked, and found to be intact.  The intraocular lens was inserted into the capsular bag.  The irrigation/aspiration handpiece was used to remove any remaining viscoelastic.  The clear corneal wound and paracentesis wounds were then hydrated and checked with Weck-Cels to be watertight.  The lid-speculum and drape was removed, and the patient's face was cleaned with a wet and dry 4x4. A clear shield was taped over the eye. The patient was taken to the post-operative care unit in good condition, having tolerated the procedure well.  Post-Op Instructions: [facility] and will receive all other orders and instructions.

## 2021-01-27 NOTE — Interval H&P Note (Signed)
History and Physical Interval Note:  01/27/2021 1:59 PM  Margaret York  has presented today for surgery, with the diagnosis of nuclear cataract right eye.  The various methods of treatment have been discussed with the patient and family. After consideration of risks, benefits and other options for treatment, the patient has consented to  Procedure(s) with comments: CATARACT EXTRACTION PHACO AND INTRAOCULAR LENS PLACEMENT (IOC) (Right) - right as a surgical intervention.  The patient's history has been reviewed, patient examined, no change in status, stable for surgery.  I have reviewed the patient's chart and labs.  Questions were answered to the patient's satisfaction.     Fabio Pierce

## 2021-01-27 NOTE — Anesthesia Postprocedure Evaluation (Signed)
Anesthesia Post Note  Patient: Margaret York  Procedure(s) Performed: CATARACT EXTRACTION PHACO AND INTRAOCULAR LENS PLACEMENT (IOC) (Right: Eye)  Anesthesia Type: MAC Anesthetic complications: no   No notable events documented.   Last Vitals:  Vitals:   01/27/21 1234 01/27/21 1424  BP: 127/74 136/76  Pulse: 84 82  Resp: 15 18  Temp: 36.9 C (!) 36.3 C  SpO2: 100% 98%    Last Pain:  Vitals:   01/27/21 1424  TempSrc: Oral  PainSc: 2                  Gertude Benito Hristova

## 2021-01-28 ENCOUNTER — Encounter (HOSPITAL_COMMUNITY): Payer: Self-pay | Admitting: Ophthalmology

## 2021-02-16 DIAGNOSIS — Z6830 Body mass index (BMI) 30.0-30.9, adult: Secondary | ICD-10-CM | POA: Diagnosis not present

## 2021-02-16 DIAGNOSIS — E782 Mixed hyperlipidemia: Secondary | ICD-10-CM | POA: Diagnosis not present

## 2021-02-16 DIAGNOSIS — Z1389 Encounter for screening for other disorder: Secondary | ICD-10-CM | POA: Diagnosis not present

## 2021-02-16 DIAGNOSIS — Z0000 Encounter for general adult medical examination without abnormal findings: Secondary | ICD-10-CM | POA: Diagnosis not present

## 2021-02-16 DIAGNOSIS — E6609 Other obesity due to excess calories: Secondary | ICD-10-CM | POA: Diagnosis not present

## 2021-02-16 DIAGNOSIS — Z23 Encounter for immunization: Secondary | ICD-10-CM | POA: Diagnosis not present

## 2021-02-25 DIAGNOSIS — H524 Presbyopia: Secondary | ICD-10-CM | POA: Diagnosis not present

## 2021-05-25 DIAGNOSIS — L821 Other seborrheic keratosis: Secondary | ICD-10-CM | POA: Diagnosis not present

## 2021-05-25 DIAGNOSIS — Z1283 Encounter for screening for malignant neoplasm of skin: Secondary | ICD-10-CM | POA: Diagnosis not present

## 2021-05-25 DIAGNOSIS — B078 Other viral warts: Secondary | ICD-10-CM | POA: Diagnosis not present

## 2021-05-25 DIAGNOSIS — D225 Melanocytic nevi of trunk: Secondary | ICD-10-CM | POA: Diagnosis not present

## 2021-05-25 DIAGNOSIS — C44722 Squamous cell carcinoma of skin of right lower limb, including hip: Secondary | ICD-10-CM | POA: Diagnosis not present

## 2021-07-06 DIAGNOSIS — Z08 Encounter for follow-up examination after completed treatment for malignant neoplasm: Secondary | ICD-10-CM | POA: Diagnosis not present

## 2021-07-06 DIAGNOSIS — B351 Tinea unguium: Secondary | ICD-10-CM | POA: Diagnosis not present

## 2021-07-06 DIAGNOSIS — Z85828 Personal history of other malignant neoplasm of skin: Secondary | ICD-10-CM | POA: Diagnosis not present

## 2021-07-06 DIAGNOSIS — L570 Actinic keratosis: Secondary | ICD-10-CM | POA: Diagnosis not present

## 2022-01-26 DIAGNOSIS — Z08 Encounter for follow-up examination after completed treatment for malignant neoplasm: Secondary | ICD-10-CM | POA: Diagnosis not present

## 2022-01-26 DIAGNOSIS — L570 Actinic keratosis: Secondary | ICD-10-CM | POA: Diagnosis not present

## 2022-01-26 DIAGNOSIS — Z85828 Personal history of other malignant neoplasm of skin: Secondary | ICD-10-CM | POA: Diagnosis not present

## 2022-01-26 DIAGNOSIS — X32XXXD Exposure to sunlight, subsequent encounter: Secondary | ICD-10-CM | POA: Diagnosis not present

## 2022-02-28 DIAGNOSIS — Z1331 Encounter for screening for depression: Secondary | ICD-10-CM | POA: Diagnosis not present

## 2022-02-28 DIAGNOSIS — Z0001 Encounter for general adult medical examination with abnormal findings: Secondary | ICD-10-CM | POA: Diagnosis not present

## 2022-02-28 DIAGNOSIS — E785 Hyperlipidemia, unspecified: Secondary | ICD-10-CM | POA: Diagnosis not present

## 2022-02-28 DIAGNOSIS — F32A Depression, unspecified: Secondary | ICD-10-CM | POA: Diagnosis not present

## 2022-07-25 DIAGNOSIS — E6609 Other obesity due to excess calories: Secondary | ICD-10-CM | POA: Diagnosis not present

## 2022-07-25 DIAGNOSIS — Z6830 Body mass index (BMI) 30.0-30.9, adult: Secondary | ICD-10-CM | POA: Diagnosis not present

## 2022-07-25 DIAGNOSIS — J9801 Acute bronchospasm: Secondary | ICD-10-CM | POA: Diagnosis not present

## 2022-07-25 DIAGNOSIS — J0100 Acute maxillary sinusitis, unspecified: Secondary | ICD-10-CM | POA: Diagnosis not present

## 2022-07-25 DIAGNOSIS — E785 Hyperlipidemia, unspecified: Secondary | ICD-10-CM | POA: Diagnosis not present

## 2022-08-03 DIAGNOSIS — L821 Other seborrheic keratosis: Secondary | ICD-10-CM | POA: Diagnosis not present

## 2022-08-03 DIAGNOSIS — Z85828 Personal history of other malignant neoplasm of skin: Secondary | ICD-10-CM | POA: Diagnosis not present

## 2022-08-03 DIAGNOSIS — L820 Inflamed seborrheic keratosis: Secondary | ICD-10-CM | POA: Diagnosis not present

## 2022-08-03 DIAGNOSIS — Z08 Encounter for follow-up examination after completed treatment for malignant neoplasm: Secondary | ICD-10-CM | POA: Diagnosis not present

## 2022-12-01 DIAGNOSIS — Z1231 Encounter for screening mammogram for malignant neoplasm of breast: Secondary | ICD-10-CM | POA: Diagnosis not present

## 2022-12-28 DIAGNOSIS — Z01419 Encounter for gynecological examination (general) (routine) without abnormal findings: Secondary | ICD-10-CM | POA: Diagnosis not present

## 2023-04-20 DIAGNOSIS — Z6828 Body mass index (BMI) 28.0-28.9, adult: Secondary | ICD-10-CM | POA: Diagnosis not present

## 2023-04-20 DIAGNOSIS — F32A Depression, unspecified: Secondary | ICD-10-CM | POA: Diagnosis not present

## 2023-04-20 DIAGNOSIS — Z0001 Encounter for general adult medical examination with abnormal findings: Secondary | ICD-10-CM | POA: Diagnosis not present

## 2023-04-20 DIAGNOSIS — Z23 Encounter for immunization: Secondary | ICD-10-CM | POA: Diagnosis not present

## 2023-04-20 DIAGNOSIS — E663 Overweight: Secondary | ICD-10-CM | POA: Diagnosis not present

## 2023-04-20 DIAGNOSIS — E785 Hyperlipidemia, unspecified: Secondary | ICD-10-CM | POA: Diagnosis not present

## 2023-04-20 DIAGNOSIS — Z1331 Encounter for screening for depression: Secondary | ICD-10-CM | POA: Diagnosis not present

## 2023-07-19 DIAGNOSIS — R1032 Left lower quadrant pain: Secondary | ICD-10-CM | POA: Diagnosis not present

## 2023-07-19 DIAGNOSIS — F17210 Nicotine dependence, cigarettes, uncomplicated: Secondary | ICD-10-CM | POA: Diagnosis not present

## 2023-07-19 DIAGNOSIS — E663 Overweight: Secondary | ICD-10-CM | POA: Diagnosis not present

## 2023-07-19 DIAGNOSIS — K5792 Diverticulitis of intestine, part unspecified, without perforation or abscess without bleeding: Secondary | ICD-10-CM | POA: Diagnosis not present

## 2023-07-19 DIAGNOSIS — N200 Calculus of kidney: Secondary | ICD-10-CM | POA: Diagnosis not present

## 2023-07-19 DIAGNOSIS — K5732 Diverticulitis of large intestine without perforation or abscess without bleeding: Secondary | ICD-10-CM | POA: Diagnosis not present

## 2023-07-19 DIAGNOSIS — R100 Acute abdomen: Secondary | ICD-10-CM | POA: Diagnosis not present

## 2023-07-19 DIAGNOSIS — Z7982 Long term (current) use of aspirin: Secondary | ICD-10-CM | POA: Diagnosis not present

## 2023-07-19 DIAGNOSIS — Z792 Long term (current) use of antibiotics: Secondary | ICD-10-CM | POA: Diagnosis not present

## 2023-07-19 DIAGNOSIS — K5790 Diverticulosis of intestine, part unspecified, without perforation or abscess without bleeding: Secondary | ICD-10-CM | POA: Diagnosis not present

## 2023-07-19 DIAGNOSIS — Z6829 Body mass index (BMI) 29.0-29.9, adult: Secondary | ICD-10-CM | POA: Diagnosis not present

## 2023-07-19 DIAGNOSIS — Z885 Allergy status to narcotic agent status: Secondary | ICD-10-CM | POA: Diagnosis not present

## 2023-07-19 DIAGNOSIS — D72829 Elevated white blood cell count, unspecified: Secondary | ICD-10-CM | POA: Diagnosis not present

## 2023-07-19 DIAGNOSIS — K5720 Diverticulitis of large intestine with perforation and abscess without bleeding: Secondary | ICD-10-CM | POA: Diagnosis not present

## 2023-07-19 DIAGNOSIS — Z20828 Contact with and (suspected) exposure to other viral communicable diseases: Secondary | ICD-10-CM | POA: Diagnosis not present

## 2023-07-19 DIAGNOSIS — Z79899 Other long term (current) drug therapy: Secondary | ICD-10-CM | POA: Diagnosis not present

## 2023-07-19 DIAGNOSIS — R6889 Other general symptoms and signs: Secondary | ICD-10-CM | POA: Diagnosis not present

## 2023-07-19 DIAGNOSIS — Z888 Allergy status to other drugs, medicaments and biological substances status: Secondary | ICD-10-CM | POA: Diagnosis not present

## 2023-07-19 DIAGNOSIS — R1012 Left upper quadrant pain: Secondary | ICD-10-CM | POA: Diagnosis not present

## 2023-07-19 DIAGNOSIS — Z882 Allergy status to sulfonamides status: Secondary | ICD-10-CM | POA: Diagnosis not present

## 2023-08-01 DIAGNOSIS — Z800 Family history of malignant neoplasm of digestive organs: Secondary | ICD-10-CM | POA: Diagnosis not present

## 2023-08-01 DIAGNOSIS — K5720 Diverticulitis of large intestine with perforation and abscess without bleeding: Secondary | ICD-10-CM | POA: Diagnosis not present

## 2023-08-04 DIAGNOSIS — E663 Overweight: Secondary | ICD-10-CM | POA: Diagnosis not present

## 2023-08-04 DIAGNOSIS — Z6828 Body mass index (BMI) 28.0-28.9, adult: Secondary | ICD-10-CM | POA: Diagnosis not present

## 2023-08-04 DIAGNOSIS — K5721 Diverticulitis of large intestine with perforation and abscess with bleeding: Secondary | ICD-10-CM | POA: Diagnosis not present

## 2023-09-25 DIAGNOSIS — Z882 Allergy status to sulfonamides status: Secondary | ICD-10-CM | POA: Diagnosis not present

## 2023-09-25 DIAGNOSIS — D127 Benign neoplasm of rectosigmoid junction: Secondary | ICD-10-CM | POA: Diagnosis not present

## 2023-09-25 DIAGNOSIS — Z800 Family history of malignant neoplasm of digestive organs: Secondary | ICD-10-CM | POA: Diagnosis not present

## 2023-09-25 DIAGNOSIS — F17210 Nicotine dependence, cigarettes, uncomplicated: Secondary | ICD-10-CM | POA: Diagnosis not present

## 2023-09-25 DIAGNOSIS — Z1211 Encounter for screening for malignant neoplasm of colon: Secondary | ICD-10-CM | POA: Diagnosis not present

## 2023-09-25 DIAGNOSIS — Z7982 Long term (current) use of aspirin: Secondary | ICD-10-CM | POA: Diagnosis not present

## 2023-09-25 DIAGNOSIS — E785 Hyperlipidemia, unspecified: Secondary | ICD-10-CM | POA: Diagnosis not present

## 2023-09-25 DIAGNOSIS — K5730 Diverticulosis of large intestine without perforation or abscess without bleeding: Secondary | ICD-10-CM | POA: Diagnosis not present

## 2023-09-25 DIAGNOSIS — Z79899 Other long term (current) drug therapy: Secondary | ICD-10-CM | POA: Diagnosis not present

## 2023-09-25 DIAGNOSIS — K635 Polyp of colon: Secondary | ICD-10-CM | POA: Diagnosis not present

## 2023-10-18 ENCOUNTER — Encounter: Payer: Self-pay | Admitting: *Deleted

## 2023-10-25 ENCOUNTER — Other Ambulatory Visit: Payer: Self-pay

## 2023-10-25 ENCOUNTER — Telehealth: Payer: Self-pay | Admitting: Gastroenterology

## 2023-10-25 ENCOUNTER — Encounter: Payer: Self-pay | Admitting: *Deleted

## 2023-10-25 ENCOUNTER — Encounter: Payer: Self-pay | Admitting: Gastroenterology

## 2023-10-25 ENCOUNTER — Ambulatory Visit: Admitting: Gastroenterology

## 2023-10-25 VITALS — BP 136/81 | HR 67 | Temp 98.6°F | Ht 63.0 in | Wt 159.2 lb

## 2023-10-25 DIAGNOSIS — R011 Cardiac murmur, unspecified: Secondary | ICD-10-CM

## 2023-10-25 DIAGNOSIS — K5720 Diverticulitis of large intestine with perforation and abscess without bleeding: Secondary | ICD-10-CM | POA: Insufficient documentation

## 2023-10-25 DIAGNOSIS — D509 Iron deficiency anemia, unspecified: Secondary | ICD-10-CM

## 2023-10-25 DIAGNOSIS — D649 Anemia, unspecified: Secondary | ICD-10-CM | POA: Diagnosis not present

## 2023-10-25 DIAGNOSIS — Z8719 Personal history of other diseases of the digestive system: Secondary | ICD-10-CM

## 2023-10-25 DIAGNOSIS — Z800 Family history of malignant neoplasm of digestive organs: Secondary | ICD-10-CM | POA: Insufficient documentation

## 2023-10-25 NOTE — Telephone Encounter (Signed)
 Patient seen in office today. Reviewed labs from recent admission. We need to update CBC to rule out persistent anemia.

## 2023-10-25 NOTE — Telephone Encounter (Signed)
 Pt was made aware and verbalized understanding. Lab was ordered and pt was instructed to have completed.

## 2023-10-25 NOTE — Progress Notes (Signed)
 GI Office Note    Referring Provider: Terese Fendt, MD Primary Care Physician:  Jodi Munroe  Primary Gastroenterologist: Rheba Cedar, MD   Chief Complaint   Chief Complaint  Patient presents with   Diverticulitis    Had a bout of diverticulitis in February, no issues at the present     History of Present Illness   Margaret York is a 68 y.o. female presenting today at the request of Dr. Lurene Salm for diverticulitis, FH of CRC in father. Recent incomplete colonoscopy.  Attempted colonoscopy 09/25/23, incomplete due to tight turn in sigmoid colon, unable to advance pediatric or adult colonoscope. Diverticulosis noted. Five polyps removed, hyperplastic.  Patient had complicated diverticulitis back in February.  She required admission with IV antibiotics.  She states this was her first ever documented episode of diverticulitis.  States prior to onset of illness she had a couple rounds of months, mother was in hospice, significant other diagnosed with liver cancer was undergoing chemotherapy.  However the day she was admitted into the hospital was the first day of abdominal pain.  Developed acute onset abdominal pain.  Standpoint she seems to be doing much better.  She denies any persistent abdominal pain.  Bowel movements are regular.  Stools are soft to loose.  No melena or rectal bleeding.  Denies any upper GI symptoms.  No unintentional weight loss.     CT A/P with IV contrast 07/2023: 1. Acute diverticulitis of the proximal descending colon with  focally contained microperforation. No drainable fluid collection or  abscess.  2. A 3 mm nonobstructing left renal inferior pole calculus. No  hydronephrosis.  3. Aortic Atherosclerosis (ICD10-I70.0).   Colonoscopy 2010:  IMPRESSION:  -Anal papilla, polyps in the sigmoid and rectum resected with snare      as described above, diminutive polyps in rectum and sigmoid ablated      as described  above. -Left-sided diverticula.  Remaining colonic mucosa appeared normal. -hyperplastic polyps    Medications   Current Outpatient Medications  Medication Sig Dispense Refill   aspirin EC 81 MG tablet Take 81 mg by mouth daily. Swallow whole.     B Complex-C (SUPER B COMPLEX PO) Take 1 tablet by mouth daily.     Collagen-Vitamin C (SUPER COLLAGEN PLUS VITAMIN C PO) Take 1 capsule by mouth in the morning and at bedtime.     diphenhydrAMINE (BENADRYL) 25 MG tablet Take 25 mg by mouth 2 (two) times daily as needed for allergies.     escitalopram (LEXAPRO) 10 MG tablet Take 10 mg by mouth daily.     loratadine (CLARITIN) 10 MG tablet Take 10 mg by mouth daily.     Multiple Vitamin (MULTIVITAMIN WITH MINERALS) TABS tablet Take 1 tablet by mouth daily.     Omega-3 Fatty Acids (FISH OIL) 1000 MG CAPS Take 2,000 mg by mouth in the morning and at bedtime.     No current facility-administered medications for this visit.    Allergies   Allergies as of 10/25/2023 - Review Complete 10/25/2023  Allergen Reaction Noted   Sulfa antibiotics Hives 01/28/2020   Codeine Nausea Only 10/26/2020   Zyban [bupropion]  10/26/2020   Bactrim [sulfamethoxazole-trimethoprim] Hives and Rash 10/26/2020    Past Medical History   Past Medical History:  Diagnosis Date   Anxiety    Diverticulitis large intestine    HTN (hypertension)    Hyperlipidemia    Situational depression     Past Surgical History  Past Surgical History:  Procedure Laterality Date   BREAST SURGERY Right    benign   BUNIONECTOMY Bilateral    CATARACT EXTRACTION W/PHACO Left 01/01/2021   Procedure: CATARACT EXTRACTION PHACO AND INTRAOCULAR LENS PLACEMENT (IOC);  Surgeon: Tarri Farm, MD;  Location: [facility];  Service: Ophthalmology;  Laterality: Left;  CDE   6.23   CATARACT EXTRACTION W/PHACO Right 01/27/2021   Procedure: CATARACT EXTRACTION PHACO AND INTRAOCULAR LENS PLACEMENT (IOC);  Surgeon: Tarri Farm, MD;  Location: [facility];  Service: Ophthalmology;  Laterality: Right;  CDE 7.25   JOINT REPLACEMENT     2nd toe left foot   TUBAL LIGATION      Past Family History   Family History  Problem Relation Age of Onset   Colon cancer Father        60s    Past Social History   Social History   Socioeconomic History   Marital status: Significant Other    Spouse name: Not on file   Number of children: Not on file   Years of education: Not on file   Highest education level: Not on file  Occupational History   Not on file  Tobacco Use   Smoking status: Every Day    Current packs/day: 0.50    Average packs/day: 0.5 packs/day for 53.4 years (26.7 ttl pk-yrs)    Types: Cigarettes    Start date: 41   Smokeless tobacco: Never  Vaping Use   Vaping status: Never Used  Substance and Sexual Activity   Alcohol use: Not Currently   Drug use: Never   Sexual activity: Yes  Other Topics Concern   Not on file  Social History Narrative   ** Merged History Encounter **       Social Drivers of Health   Financial Resource Strain: Low Risk  (07/20/2023)   Received from [facility]   Overall Financial Resource Strain (CARDIA)    Difficulty of Paying Living Expenses: Not hard at all  Food Insecurity: No Food Insecurity (07/20/2023)   Received from [facility]   Hunger Vital Sign    Worried About Running Out of Food in the Last Year: Never true    Ran Out of Food in the Last Year: Never true  Transportation Needs: No Transportation Needs (07/20/2023)   [facility] from [facility]   PRAPARE - Transportation    Lack of Transportation (Medical): No    Lack of Transportation (Non-Medical): No  Physical Activity: Insufficiently Active (07/20/2023)   Received from [facility]   Exercise Vital Sign    Days of Exercise per Week: 2 days    Minutes of Exercise per Session: 20 min  Stress: No Stress Concern Present (07/20/2023)   Received from [facility] -  Occupational Stress Questionnaire    Feeling of Stress : Only a little  Social Connections: Socially Integrated (07/20/2023)   Received from [facility]   Social Connection and Isolation Panel [NHANES]    Frequency of Communication with Friends and Family: Three times a week    Frequency of Social Gatherings with Friends and Family: Three times a week    Attends Religious Services: 1 to 4 times per year    Active Member of Clubs or Organizations: No    Attends Banker Meetings: 1 to 4 times per year    Marital Status: Living with partner  Intimate Partner Violence: Not At Risk (09/25/2023)  Received from [facility]   Humiliation, Afraid, Rape, and Kick questionnaire    Fear of Current or Ex-Partner: No    Emotionally Abused: No    Physically Abused: No    Sexually Abused: No    Review of Systems   General: Negative for anorexia, weight loss, fever, chills, fatigue, weakness. Eyes: Negative for vision changes.  ENT: Negative for hoarseness, difficulty swallowing , nasal congestion. CV: Negative for chest pain, angina, palpitations, dyspnea on exertion, peripheral edema.  Respiratory: Negative for dyspnea at rest, dyspnea on exertion, cough, sputum, wheezing.  GI: See history of present illness. GU:  Negative for dysuria, hematuria, urinary incontinence, urinary frequency, nocturnal urination.  MS: Negative for joint pain, low back pain.  Derm: Negative for rash or itching.  Neuro: Negative for weakness, abnormal sensation, seizure, frequent headaches, memory loss,  confusion.  Psych: Negative for anxiety, depression, suicidal ideation, hallucinations.  Endo: Negative for unusual weight change.  Heme: Negative for bruising or bleeding. Allergy: Negative for rash or hives.  Physical Exam   BP 136/81 (BP Location: Right Arm, Patient Position: Sitting, Cuff Size: Normal)   Pulse 67   Temp 98.6 F (37 C) (Oral)   Ht 5\' 3"  (1.6 m)   Wt 159 lb 3.2 oz (72.2  kg)   SpO2 96%   BMI 28.20 kg/m    General: Well-nourished, well-developed in no acute distress.  Head: Normocephalic, atraumatic.   Eyes: Conjunctiva pink, no icterus. Mouth: Oropharyngeal mucosa moist and pink  Neck: Supple without thyromegaly, masses, or lymphadenopathy.  Lungs: Clear to auscultation bilaterally.  Heart: Regular rate and rhythm, no  rubs or gallops. Faint sem Abdomen: Bowel sounds are normal, nontender, nondistended, no hepatosplenomegaly or masses,  no abdominal bruits or hernia, no rebound or guarding.   Rectal: not performed Extremities: No lower extremity edema. No clubbing or deformities.  Neuro: Alert and oriented x 4 , grossly normal neurologically.  Skin: Warm and dry, no rash or jaundice.   Psych: Alert and cooperative, normal mood and affect.  Labs   Initial CBC February 12, day of admission, hemoglobin 14.6.  White blood cell count 18.9.  Day of discharge on February 15, hemoglobin 10.6.  Imaging Studies   No results found.  Assessment/Plan:   Complicated diverticulitis with perforation: -symptoms resolved -Dr. Lurene Salm attempted colonoscopy but could not pass adult/pediatric scope beyond sigmoid colon -patient agreeable to another attempt for complete colonoscopy by Dr. Riley Cheadle. ASA 2.  I have discussed the risks, alternatives, benefits with regards to but not limited to the risk of reaction to medication, bleeding, infection, perforation and the patient is agreeable to proceed. Written consent to be obtained. -she will let us  know if recurrent abdominal pain between now and upcoming colonoscopy  Anemia: possibly hemodilution -will update CBC  Faint heart murmur: -encouraged her to discuss possible echo with PCP    Margaret York, MHS, PA-C [facility]

## 2023-10-25 NOTE — Patient Instructions (Signed)
 Colonoscopy to be scheduled. Separate instructions to be provided.  Please let me know if you develop recurrent abdominal pain between now and your colonoscopy.  Consider talking with your primary care provider regarding heart murmur noted today.

## 2023-10-26 ENCOUNTER — Telehealth: Payer: Self-pay | Admitting: *Deleted

## 2023-10-26 ENCOUNTER — Encounter: Payer: Self-pay | Admitting: *Deleted

## 2023-10-26 ENCOUNTER — Ambulatory Visit: Payer: Self-pay | Admitting: Gastroenterology

## 2023-10-26 LAB — CBC WITH DIFFERENTIAL/PLATELET
Basophils Absolute: 0.1 10*3/uL (ref 0.0–0.2)
Basos: 1 %
EOS (ABSOLUTE): 0.1 10*3/uL (ref 0.0–0.4)
Eos: 2 %
Hematocrit: 39.2 % (ref 34.0–46.6)
Hemoglobin: 13.5 g/dL (ref 11.1–15.9)
Immature Grans (Abs): 0 10*3/uL (ref 0.0–0.1)
Immature Granulocytes: 0 %
Lymphocytes Absolute: 3 10*3/uL (ref 0.7–3.1)
Lymphs: 44 %
MCH: 32.4 pg (ref 26.6–33.0)
MCHC: 34.4 g/dL (ref 31.5–35.7)
MCV: 94 fL (ref 79–97)
Monocytes Absolute: 0.5 10*3/uL (ref 0.1–0.9)
Monocytes: 8 %
Neutrophils Absolute: 3.1 10*3/uL (ref 1.4–7.0)
Neutrophils: 45 %
Platelets: 262 10*3/uL (ref 150–450)
RBC: 4.17 x10E6/uL (ref 3.77–5.28)
RDW: 12.5 % (ref 11.7–15.4)
WBC: 6.9 10*3/uL (ref 3.4–10.8)

## 2023-10-26 NOTE — Telephone Encounter (Signed)
 Pt needed to reschedule her procedure due to her fiance having chemo on 11/16/23. She is rescheduled to 11/30/23 at 9:15 am.  Updated instructions mailed to pt

## 2023-11-23 NOTE — Telephone Encounter (Signed)
 noted

## 2023-11-23 NOTE — Telephone Encounter (Signed)
 Pt called to cancel procedure due to her fiance' going through  chemo and has to be at [facility] on 12/01/23 and she is in the process of finding another PCP. She says she will call back to reschedule once she gets thing situated.

## 2023-11-30 ENCOUNTER — Encounter (HOSPITAL_COMMUNITY): Payer: Self-pay

## 2023-11-30 ENCOUNTER — Ambulatory Visit (HOSPITAL_COMMUNITY): Admit: 2023-11-30 | Admitting: Internal Medicine

## 2023-11-30 SURGERY — COLONOSCOPY
Anesthesia: Choice

## 2024-04-09 ENCOUNTER — Encounter: Payer: Self-pay | Admitting: *Deleted

## 2024-04-09 ENCOUNTER — Encounter: Payer: Self-pay | Admitting: Internal Medicine

## 2024-04-09 ENCOUNTER — Ambulatory Visit: Admitting: Internal Medicine

## 2024-04-09 VITALS — BP 149/76 | HR 92 | Temp 98.4°F | Ht 63.0 in | Wt 148.8 lb

## 2024-04-09 DIAGNOSIS — Z800 Family history of malignant neoplasm of digestive organs: Secondary | ICD-10-CM

## 2024-04-09 DIAGNOSIS — K5720 Diverticulitis of large intestine with perforation and abscess without bleeding: Secondary | ICD-10-CM | POA: Diagnosis not present

## 2024-04-09 NOTE — Progress Notes (Unsigned)
 Gastroenterology Progress Note    Primary Care Physician:  Marvine Rush, MD Primary Gastroenterologist:  Dr. Shaaron  Pre-Procedure History & Physical: HPI:  Margaret York is a 68 y.o. female here for   For consideration of a diagnostic colonoscopy patient's had multiple bouts of diverticulitis and contained perforation in February for which he was hospitalized at [facility] recurrent symptoms over the past couple of months 3 rounds of antibiotics finished her last round 1 week ago.  She states she is now 100% better) having fairly normal bowel movements no up no more abdominal pain no fever or chills.  Earlier this year Dr. Davy attempted a colonoscopy could not get by a flexure incomplete exam however she did remove multiple polyps in the area that was surveyed.    Patient reports she had a colonoscopy at [facility] over 10 years ago.  I cannot find those records in our database.   Family history significant for patient's father with colon cancer diagnosed in his early 50s.    Past Medical History:  Diagnosis Date   Anxiety    Diverticulitis large intestine    HTN (hypertension)    Hyperlipidemia    Situational depression     Past Surgical History:  Procedure Laterality Date   BREAST SURGERY Right    benign   BUNIONECTOMY Bilateral    CATARACT EXTRACTION W/PHACO Left 01/01/2021   Procedure: CATARACT EXTRACTION PHACO AND INTRAOCULAR LENS PLACEMENT (IOC);  Surgeon: Harrie Agent, MD;  Location: [facility];  Service: Ophthalmology;  Laterality: Left;  CDE   6.23   CATARACT EXTRACTION W/PHACO Right 01/27/2021   Procedure: CATARACT EXTRACTION PHACO AND INTRAOCULAR LENS PLACEMENT (IOC);  Surgeon: Harrie Agent, MD;  Location: [facility];  Service: Ophthalmology;  Laterality: Right;  CDE 7.25   JOINT REPLACEMENT     2nd toe left foot   TUBAL LIGATION      Prior to Admission medications   Medication Sig Start Date End Date Taking? Authorizing Provider  aspirin EC 81 MG tablet Take 81  mg by mouth daily. Swallow whole.   Yes [provider]  B Complex-C (SUPER B COMPLEX PO) Take 1 tablet by mouth daily.   Yes [provider]  Collagen-Vitamin C (SUPER COLLAGEN PLUS VITAMIN C PO) Take 1 capsule by mouth in the morning and at bedtime.   Yes [provider]  diphenhydrAMINE (BENADRYL) 25 MG tablet Take 25 mg by mouth 2 (two) times daily as needed for allergies.   Yes [provider]  escitalopram (LEXAPRO) 10 MG tablet Take 10 mg by mouth daily. 07/11/23  Yes [provider]  loratadine (CLARITIN) 10 MG tablet Take 10 mg by mouth daily.   Yes [provider]  Multiple Vitamin (MULTIVITAMIN WITH MINERALS) TABS tablet Take 1 tablet by mouth daily.   Yes [provider]  Omega-3 Fatty Acids (FISH OIL) 1000 MG CAPS Take 2,000 mg by mouth in the morning and at bedtime.   Yes [provider]    Allergies as of 04/09/2024 - Review Complete 04/09/2024  Allergen Reaction Noted   Sulfa antibiotics Hives 01/28/2020   Codeine Nausea Only 10/26/2020   Zyban [bupropion]  10/26/2020   Bactrim [sulfamethoxazole-trimethoprim] Hives and Rash 10/26/2020    Family History  Problem Relation Age of Onset   Colon cancer Father        78s    Social History   Socioeconomic History   Marital status: Significant Other    Spouse  name: Not on file   Number of children: Not on file   Years of education: Not on file   Highest education level: Not on file  Occupational History   Not on file  Tobacco Use   Smoking status: Every Day    Current packs/day: 0.50    Average packs/day: 0.5 packs/day for 53.8 years (26.9 ttl pk-yrs)    Types: Cigarettes    Start date: 57   Smokeless tobacco: Never  Vaping Use   Vaping status: Never Used  Substance and Sexual Activity   Alcohol use: Not Currently   Drug use: Never   Sexual activity: Yes  Other Topics Concern   Not on file  Social History Narrative   ** Merged History  Encounter **       Social Drivers of Health   Financial Resource Strain: Low Risk  (07/20/2023)   Received from [facility]   Overall Financial Resource Strain (CARDIA)    Difficulty of Paying Living Expenses: Not hard at all  Food Insecurity: No Food Insecurity (07/20/2023)   Received from [facility]   Hunger Vital Sign    Within the past 12 months, you worried that your food would run out before you got the money to buy more.: Never true    Within the past 12 months, the food you bought just didn't last and you didn't have money to get more.: Never true  Transportation Needs: No Transportation Needs (07/20/2023)   Received from [facility]   PRAPARE - Transportation    Lack of Transportation (Medical): No    Lack of Transportation (Non-Medical): No  Physical Activity: Insufficiently Active (07/20/2023)   Received from [facility]   Exercise Vital Sign    On average, how many days per week do you engage in moderate to strenuous exercise (like a brisk walk)?: 2 days    On average, how many minutes do you engage in exercise at this level?: 20 min  Stress: No Stress Concern Present (07/20/2023)   Received from [facility] of Occupational Health - Occupational Stress Questionnaire    Feeling of Stress : Only a little  Social Connections: Socially Integrated (07/20/2023)   Received from [facility]   Social Connection and Isolation Panel    In a typical week, how many times do you talk on the phone with family, friends, or neighbors?: Three times a week    How often do you get together with friends or relatives?: Three times a week    How often do you attend church or religious services?: 1 to 4 times per year    Do you belong to any clubs or organizations such as church groups, unions, fraternal or athletic groups, or school groups?: No    How often do you attend meetings of the clubs or organizations you belong to?: 1 to 4 times per year    Are  you married, widowed, divorced, separated, never married, or living with a partner?: Living with partner  Intimate Partner Violence: Not At Risk (09/25/2023)   Received from [facility]   Humiliation, Afraid, Rape, and Kick questionnaire    Within the last year, have you been afraid of your partner or ex-partner?: No    Within the last year, have you been humiliated or emotionally abused in other ways by your partner or ex-partner?: No    Within the last year, have you been kicked, hit, slapped, or  otherwise physically hurt by your partner or ex-partner?: No    Within the last year, have you been raped or forced to have any kind of sexual activity by your partner or ex-partner?: No    Review of Systems   See HPI, otherwise negative ROS  Physical Exam: BP (!) 149/76 (BP Location: Right Arm, Patient Position: Sitting, Cuff Size: Normal)   Pulse 92   Temp 98.4 F (36.9 C) (Oral)   Ht 5' 3 (1.6 m)   Wt 148 lb 12.8 oz (67.5 kg)   SpO2 98%   BMI 26.36 kg/m  General:   Alert,  Well-developed, well-nourished, pleasant and cooperative in NAD SLungs:  Clear throughout to auscultation.   No wheezes, crackles, or rhonchi. No acute distress. Heart:  Regular rate and rhythm; no murmurs, clicks, rubs,  or gallops. Abdomen: Non-distended, normal bowel sounds.  Soft and nontender without appreciable mass or hepatosplenomegaly.   Impression/Plan:     Very pleasant 68 year old lady with recurrent bouts of diverticulitis contained perforation noted earlier this year.  Fortunately, attacks have been treated successfully with conservative measures.  She is now clinically recovered.   last for antibiotics taken 1 week ago.  She ought to go ahead and have a colonoscopy in the near future.  Technical difficulties encountered with her previous attempted completing her colon examination.  I told her that she likely has had adhesions and I may run into the same  challenges.  Could not guarantee complete exam  but will make my best effort.The risks, benefits, limitations, alternatives and imponderables have been reviewed with the patient. Questions have been answered. All parties are agreeable.   ASA 2.   Will plan to perform a diagnostic colonoscopy  no sooner than the first week of December.   Further recommendations to follow  We will utilize the pediatric colonoscope.    Notice: This dictation was prepared with Dragon dictation along with smaller phrase technology. Any transcriptional errors that result from this process are unintentional and may not be corrected upon review.

## 2024-04-09 NOTE — Patient Instructions (Signed)
 It was good to see you today.  As discussed, you need to have a diagnostic colonoscopy.  We need to wait till the first week of Thanksgiving to let your diverticulosis completely heal.  We will schedule a diagnostic colonoscopy (abnormal colon on CT) ASA 2 the first week after Thanksgiving or early December.  Further recommendations to follow.

## 2024-04-30 ENCOUNTER — Ambulatory Visit: Admitting: Internal Medicine

## 2024-05-09 ENCOUNTER — Encounter (HOSPITAL_COMMUNITY): Payer: Self-pay | Admitting: Internal Medicine

## 2024-05-09 ENCOUNTER — Encounter (HOSPITAL_COMMUNITY)
Admission: RE | Disposition: A | Discharge status: Home / Self Care | Payer: Self-pay | Source: Home / Self Care | Attending: Internal Medicine

## 2024-05-09 ENCOUNTER — Ambulatory Visit (HOSPITAL_COMMUNITY)

## 2024-05-09 ENCOUNTER — Other Ambulatory Visit: Payer: Self-pay

## 2024-05-09 ENCOUNTER — Ambulatory Visit (HOSPITAL_COMMUNITY)
Admission: RE | Admit: 2024-05-09 | Discharge: 2024-05-09 | Disposition: A | Discharge status: Home / Self Care | Attending: Internal Medicine | Admitting: Internal Medicine

## 2024-05-09 DIAGNOSIS — K621 Rectal polyp: Secondary | ICD-10-CM

## 2024-05-09 DIAGNOSIS — K562 Volvulus: Secondary | ICD-10-CM

## 2024-05-09 HISTORY — PX: FLEXIBLE SIGMOIDOSCOPY: SHX5431

## 2024-05-09 SURGERY — SIGMOIDOSCOPY, FLEXIBLE
Anesthesia: General

## 2024-05-09 MED ORDER — PROPOFOL 10 MG/ML IV BOLUS
INTRAVENOUS | Status: DC | PRN
  Administered 2024-05-09: 60 mg via INTRAVENOUS
  Administered 2024-05-09: 30 mg via INTRAVENOUS
  Administered 2024-05-09: 125 ug/kg/min via INTRAVENOUS

## 2024-05-09 MED ORDER — LACTATED RINGERS IV SOLN: INTRAVENOUS | Status: DC

## 2024-05-09 MED ORDER — LIDOCAINE 2% (20 MG/ML) 5 ML SYRINGE
INTRAMUSCULAR | Status: DC | PRN
  Administered 2024-05-09: 40 mg via INTRAVENOUS
  Administered 2024-05-09: 60 mg via INTRAVENOUS

## 2024-05-09 MED ORDER — GLYCOPYRROLATE PF 0.2 MG/ML IJ SOSY
PREFILLED_SYRINGE | INTRAMUSCULAR | Status: DC | PRN
  Administered 2024-05-09: .1 mg via INTRAVENOUS

## 2024-05-09 NOTE — H&P (Signed)
 @LOGO @   Gastroenterology Progress Note    Primary Care Physician:  Dow Longs, PA-C Primary Gastroenterologist:  Dr.   Pre-Procedure History & Physical: HPI:  Margaret York is a 68 y.o. female here for complicated complicated diverticulitis involving the descending segment requiring IV antibiotics earlier in the year.  Had a microperforation.  Attempted colonoscopy in April unsuccessful because of acute turn.  Positive family history colon cancer in her father.  Last full colonoscopy in 2010.  Past Medical History:  Diagnosis Date   Anxiety    Diverticulitis large intestine    HTN (hypertension)    Hyperlipidemia    Situational depression     Past Surgical History:  Procedure Laterality Date   BREAST SURGERY Right    benign   BUNIONECTOMY Bilateral    CATARACT EXTRACTION W/PHACO Left 01/01/2021   Procedure: CATARACT EXTRACTION PHACO AND INTRAOCULAR LENS PLACEMENT (IOC);  Surgeon: Harrie Agent, MD;  Location: [facility];  Service: Ophthalmology;  Laterality: Left;  CDE   6.23   CATARACT EXTRACTION W/PHACO Right 01/27/2021   Procedure: CATARACT EXTRACTION PHACO AND INTRAOCULAR LENS PLACEMENT (IOC);  Surgeon: Harrie Agent, MD;  Location: [facility];  Service: Ophthalmology;  Laterality: Right;  CDE 7.25   JOINT REPLACEMENT     2nd toe left foot   TUBAL LIGATION      Prior to Admission medications   Medication Sig Start Date End Date Taking? Authorizing Provider  aspirin EC 81 MG tablet Take 81 mg by mouth daily. Swallow whole.   Yes [provider]  B Complex-C (SUPER B COMPLEX PO) Take 1 tablet by mouth daily.   Yes [provider]  Collagen-Vitamin C (SUPER COLLAGEN PLUS VITAMIN C PO) Take 1 capsule by mouth in the morning and at bedtime.   Yes [provider]  diphenhydrAMINE (BENADRYL) 25 MG tablet Take 25 mg by mouth 2 (two) times daily as needed for allergies.   Yes [provider]  escitalopram (LEXAPRO) 10 MG tablet Take 10 mg by  mouth daily. 07/11/23  Yes [provider]  loratadine (CLARITIN) 10 MG tablet Take 10 mg by mouth daily.   Yes [provider]  Multiple Vitamin (MULTIVITAMIN WITH MINERALS) TABS tablet Take 1 tablet by mouth daily.   Yes [provider]  Omega-3 Fatty Acids (FISH OIL) 1000 MG CAPS Take 2,000 mg by mouth in the morning and at bedtime.   Yes [provider]    Allergies as of 04/09/2024 - Review Complete 04/09/2024  Allergen Reaction Noted   Sulfa antibiotics Hives 01/28/2020   Codeine Nausea Only 10/26/2020   Zyban [bupropion]  10/26/2020   Bactrim [sulfamethoxazole-trimethoprim] Hives and Rash 10/26/2020    Family History  Problem Relation Age of Onset   Colon cancer Father        57s    Social History   Socioeconomic History   Marital status: Significant Other    Spouse name: Not on file   Number of children: Not on file   Years of education: Not on file   Highest education level: Not on file  Occupational History   Not on file  Tobacco Use   Smoking status: Every Day    Current packs/day: 0.50    Average packs/day: 0.5 packs/day for 53.9 years (27.0 ttl pk-yrs)    Types: Cigarettes    Start date: 43   Smokeless tobacco: Never  Vaping Use   Vaping status: Never Used  Substance and Sexual Activity   Alcohol  use: Not Currently   Drug use: Never   Sexual activity: Yes  Other Topics Concern   Not on file  Social History Narrative   ** Merged History Encounter **       Social Drivers of Health   Financial Resource Strain: Low Risk (07/20/2023)   Received from [facility]   Overall Financial Resource Strain (CARDIA)    Difficulty of Paying Living Expenses: Not hard at all  Food Insecurity: No Food Insecurity (07/20/2023)   Received from [facility]   Hunger Vital Sign    Within the past 12 months, you worried that your food would run out before you got the money to buy more.: Never true    Within the past 12 months, the  food you bought just didn't last and you didn't have money to get more.: Never true  Transportation Needs: No Transportation Needs (07/20/2023)   Received from [facility]   PRAPARE - Transportation    Lack of Transportation (Medical): No    Lack of Transportation (Non-Medical): No  Physical Activity: Insufficiently Active (07/20/2023)   Received from [facility]   Exercise Vital Sign    On average, how many days per week do you engage in moderate to strenuous exercise (like a brisk walk)?: 2 days    On average, how many minutes do you engage in exercise at this level?: 20 min  Stress: No Stress Concern Present (07/20/2023)   Received from [facility] of Occupational Health - Occupational Stress Questionnaire    Feeling of Stress : Only a little  Social Connections: Socially Integrated (07/20/2023)   Received from [facility]   Social Connection and Isolation Panel    In a typical week, how many times do you talk on the phone with family, friends, or neighbors?: Three times a week    How often do you get together with friends or relatives?: Three times a week    How often do you attend church or religious services?: 1 to 4 times per year    Do you belong to any clubs or organizations such as church groups, unions, fraternal or athletic groups, or school groups?: No    How often do you attend meetings of the clubs or organizations you belong to?: 1 to 4 times per year    Are you married, widowed, divorced, separated, never married, or living with a partner?: Living with partner  Intimate Partner Violence: Not At Risk (09/25/2023)   Received from [facility]   Humiliation, Afraid, Rape, and Kick questionnaire    Within the last year, have you been afraid of your partner or ex-partner?: No    Within the last year, have you been humiliated or emotionally abused in other ways by your partner or ex-partner?: No    Within the last year, have you been kicked,  hit, slapped, or otherwise physically hurt by your partner or ex-partner?: No    Within the last year, have you been raped or forced to have any kind of sexual activity by your partner or ex-partner?: No    Review of Systems   See HPI, otherwise negative ROS  Physical Exam: BP (!) 142/73   Pulse 85   Temp 98.1 F (36.7 C) (Oral)   Resp 12   Ht 5' 3 (1.6 m)   Wt 68.9 kg   SpO2 94%   BMI 26.93 kg/m  General:   Alert,  Well-developed,  well-nourished, pleasant and cooperative in NAD SNeck:  Supple; no masses or thyromegaly. No significant cervical adenopathy. Lungs:  Clear throughout to auscultation.   No wheezes, crackles, or rhonchi. No acute distress. Heart:  Regular rate and rhythm; no murmurs, clicks, rubs,  or gallops. Abdomen: Non-distended, normal bowel sounds.  Soft and nontender without appreciable mass or hepatosplenomegaly.    Impression/Plan:   68 year old lady with recent history of what appears to be complicated diverticulitis she is fully recovered.  Positive family history of colon cancer.  Difficulty with completing colonoscopy elsewhere.  She is here for diagnostic colonoscopy.  The risks, benefits, limitations, alternatives and imponderables have been reviewed with the patient. Questions have been answered. All parties are agreeable.     Notice: This dictation was prepared with Dragon dictation along with smaller phrase technology. Any transcriptional errors that result from this process are unintentional and may not be corrected upon review.

## 2024-05-09 NOTE — Discharge Instructions (Addendum)
  Colonoscopy Discharge Instructions  Read the instructions outlined below and refer to this sheet in the next few weeks. These discharge instructions provide you with general information on caring for yourself after you leave the hospital. Your doctor may also give you specific instructions. While your treatment has been planned according to the most current medical practices available, unavoidable complications occasionally occur. If you have any problems or questions after discharge, call Dr. Shaaron at [phone]. ACTIVITY You may resume your regular activity, but move at a slower pace for the next 24 hours.  Take frequent rest periods for the next 24 hours.  Walking will help get rid of the air and reduce the bloated feeling in your belly (abdomen).  No driving for 24 hours (because of the medicine (anesthesia) used during the test).   Do not sign any important legal documents or operate any machinery for 24 hours (because of the anesthesia used during the test).  NUTRITION Drink plenty of fluids.  You may resume your normal diet as instructed by your doctor.  Begin with a light meal and progress to your normal diet. Heavy or fried foods are harder to digest and may make you feel sick to your stomach (nauseated).  Avoid alcoholic beverages for 24 hours or as instructed.  MEDICATIONS You may resume your normal medications unless your doctor tells you otherwise.  WHAT YOU CAN EXPECT TODAY Some feelings of bloating in the abdomen.  Passage of more gas than usual.  Spotting of blood in your stool or on the toilet paper.  IF YOU HAD POLYPS REMOVED DURING THE COLONOSCOPY: No aspirin products for 7 days or as instructed.  No alcohol for 7 days or as instructed.  Eat a soft diet for the next 24 hours.  FINDING OUT THE RESULTS OF YOUR TEST Not all test results are available during your visit. If your test results are not back during the visit, make an appointment with your caregiver to find out the  results. Do not assume everything is normal if you have not heard from your caregiver or the medical facility. It is important for you to follow up on all of your test results.  SEEK IMMEDIATE MEDICAL ATTENTION IF: You have more than a spotting of blood in your stool.  Your belly is swollen (abdominal distention).  You are nauseated or vomiting.  You have a temperature over 101.  You have abdominal pain or discomfort that is severe or gets worse throughout the day.     You have a stiff inflamed appearing descending colon which will not allow scope advancement.  You therefore, your colonoscopy was incomplete.   Follow-up with Dr. Celia.  Would consider surgical resection of this diseased segment of your colon at a later date on an elective basis.

## 2024-05-09 NOTE — Anesthesia Preprocedure Evaluation (Signed)
 Anesthesia Evaluation  Patient identified by MRN, date of birth, ID band Patient awake    Reviewed: Allergy & Precautions, H&P , NPO status , Patient's Chart, lab work & pertinent test results  Airway Mallampati: II  TM Distance: >3 FB Neck ROM: Full    Dental no notable dental hx.    Pulmonary neg pulmonary ROS, Current Smoker   Pulmonary exam normal breath sounds clear to auscultation       Cardiovascular hypertension, Normal cardiovascular exam Rhythm:Regular Rate:Normal     Neuro/Psych  PSYCHIATRIC DISORDERS Anxiety Depression    negative neurological ROS     GI/Hepatic negative GI ROS, Neg liver ROS,,,  Endo/Other  negative endocrine ROS    Renal/GU negative Renal ROS  negative genitourinary   Musculoskeletal negative musculoskeletal ROS (+)    Abdominal   Peds negative pediatric ROS (+)  Hematology negative hematology ROS (+)   Anesthesia Other Findings   Reproductive/Obstetrics negative OB ROS                              Anesthesia Physical Anesthesia Plan  ASA: 2  Anesthesia Plan: General   Post-op Pain Management:    Induction: Intravenous  PONV Risk Score and Plan:   Airway Management Planned: Nasal Cannula  Additional Equipment:   Intra-op Plan:   Post-operative Plan:   Informed Consent: I have reviewed the patients History and Physical, chart, labs and discussed the procedure including the risks, benefits and alternatives for the proposed anesthesia with the patient or authorized representative who has indicated his/her understanding and acceptance.     Dental advisory given  Plan Discussed with: CRNA  Anesthesia Plan Comments:         Anesthesia Quick Evaluation

## 2024-05-09 NOTE — Anesthesia Postprocedure Evaluation (Signed)
 Anesthesia Post Note  Patient: Margaret York  Procedure(s) Performed: [place] SIDE  Patient location during evaluation: PACU Anesthesia Type: General Level of consciousness: awake and alert Pain management: pain level controlled Vital Signs Assessment: post-procedure vital signs reviewed and stable Respiratory status: spontaneous breathing, nonlabored ventilation, respiratory function stable and patient connected to nasal cannula oxygen Cardiovascular status: blood pressure returned to baseline and stable Postop Assessment: no apparent nausea or vomiting Anesthetic complications: no   No notable events documented.   Last Vitals:  Vitals:   05/09/24 0716 05/09/24 0858  BP: (!) 142/73 (!) 144/74  Pulse: 85 88  Resp: 12 (!) 22  Temp: 36.7 C 36.8 C  SpO2: 94% 100%    Last Pain:  Vitals:   05/09/24 0858  TempSrc: Oral  PainSc: 0-No pain                 Andrea Limes

## 2024-05-09 NOTE — Op Note (Addendum)
 [facility] Patient Name: Margaret York Procedure Date: 05/09/2024 7:55 AM MRN: [id number] Date of Birth: 1955-11-10 Attending MD: Lamar Ozell Hollingshead , MD, [id number] CSN: [id number] Age: 68 Admit Type: Outpatient Procedure:                Colonoscopy Indications:              Abnormal CT of the GI tract; history of probable                            complicated diverticulitis with microperforation                            earlier this year; prior failed attempt at                            colonoscopy secondary to noncompliant left colon Providers:                Lamar Ozell Hollingshead, MD, Devere Lodge, Bascom Blush Referring MD:             Lamar Ozell Hollingshead, MD Medicines:                Propofol  per Anesthesia Complications:            No immediate complications. Estimated Blood Loss:     Estimated blood loss: none. Procedure:                Pre-Anesthesia Assessment:                           - Prior to the procedure, a History and Physical                            was performed, and patient medications and                            allergies were reviewed. The patient's tolerance of                            previous anesthesia was also reviewed. The risks                            and benefits of the procedure and the sedation                            options and risks were discussed with the patient.                            All questions were answered, and informed consent                            was obtained. Prior Anticoagulants: The patient has                            taken no anticoagulant or antiplatelet agents. ASA  Grade Assessment: III - A patient with severe                            systemic disease. After reviewing the risks and                            benefits, the patient was deemed in satisfactory                            condition to undergo the procedure.                           After obtaining  informed consent, the colonoscope                            was passed under direct vision. Throughout the                            procedure, the patient's blood pressure, pulse, and                            oxygen saturations were monitored continuously. The                            PCF-[id number] ([id number]) Peds Colon was introduced                            through the anus with the intention of advancing to                            the cecum. The scope was advanced to the descending                            colon before the procedure was aborted. Medications                            were given. The colonoscopy was performed without                            difficulty. Scope In: 8:27:50 AM Scope Out: 8:49:45 AM Total Procedure Duration: 0 hours 21 minutes 55 seconds  Findings:      The perianal and digital rectal examinations were normal. Examination of       the rectum revealed scattered subcentimeter hyperplastic appearing       polyps which were not manipulated. Advance the scope nice one-to-one       fashion into the junction of sigmoid and descending. Upstream colon       appeared edematous. Although I had the lumen in view. Was unable to       advance the pediatric colonoscope due to sigmoid looping and the       upstream descending segment rolling  around the scope. In spite of       changing the patient's position, water  immersion and strategically       placed abdominal pressure, I was unable to  overcome the anatomy for       scope advancement. I did see areas of larger polypoid mucosa in the       lumen approximately but could not lined up well to further evaluate /       biopsy. Impression:               - Aborted attempted colonoscopy. Flexible                            sigmoidoscopy scattered hyperplastic rectal polyps.                           - Descending segment edematous noncompliant                            polypoid appearing mucosa proximally.  Unable to                            advance across this area to the cecum.                           - History of complicated left-sided diverticulitis                            now with 2 failed attempts at complete colonoscopy. Moderate Sedation:      Moderate (conscious) sedation was personally administered by an       anesthesia professional. The following parameters were monitored: oxygen       saturation, heart rate, blood pressure, respiratory rate, EKG, adequacy       of pulmonary ventilation, and response to care. Recommendation:           - Patient has a contact number available for                            emergencies. The signs and symptoms of potential                            delayed complications were discussed with the                            patient. Return to normal activities tomorrow.                            Written discharge instructions were provided to the                            patient.                           - Advance diet as tolerated. Return to see Dr.                            Celia in [place]. Although we could refer to a  tertiary referral center for third attempted at                            completing a colonoscopy may be in patient's best                            interest to discuss proceeding with a segmental                            resection of her left colon. Procedure Code(s):        --- Professional ---                           [phone], 53, Colonoscopy, flexible; diagnostic,                            including collection of specimen(s) by brushing or                            washing, when performed (separate procedure) Diagnosis Code(s):        --- Professional ---                           R93.3, Abnormal findings on diagnostic imaging of                            other parts of digestive tract CPT copyright 2022 American Medical Association. All rights reserved. The codes documented in this  report are preliminary and upon coder review may  be revised to meet current compliance requirements. Lamar HERO. Lailyn Appelbaum, MD Lamar Ozell Hollingshead, MD 05/09/2024 9:05:58 AM This report has been signed electronically. Number of Addenda: 0

## 2024-05-09 NOTE — Transfer of Care (Signed)
 Immediate Anesthesia Transfer of Care Note  Patient: Margaret York  Procedure(s) Performed: KINGSTON SIDE  Patient Location: Endoscopy Unit  Anesthesia Type:General  Level of Consciousness: drowsy and patient cooperative  Airway & Oxygen Therapy: Patient Spontanous Breathing and Patient connected to nasal cannula oxygen  Post-op Assessment: Report given to RN and Post -op Vital signs reviewed and stable  Post vital signs: Reviewed and stable  Last Vitals:  Vitals Value Taken Time  BP 144/74 05/09/24 08:58  Temp 36.8 C 05/09/24 08:58  Pulse 88 05/09/24 08:58  Resp 22 05/09/24 08:58  SpO2 100 % 05/09/24 08:58    Last Pain:  Vitals:   05/09/24 0858  TempSrc: Oral  PainSc: 0-No pain      Patients Stated Pain Goal: 5 (05/09/24 0716)  Complications: No notable events documented.

## 2024-05-10 ENCOUNTER — Encounter (HOSPITAL_COMMUNITY): Payer: Self-pay | Admitting: Internal Medicine

## 2024-05-31 ENCOUNTER — Ambulatory Visit: Admitting: Cardiology

## 2024-06-24 ENCOUNTER — Encounter: Payer: Self-pay | Admitting: Cardiology

## 2024-06-24 ENCOUNTER — Ambulatory Visit: Admitting: Cardiology

## 2024-06-24 VITALS — BP 132/82 | HR 84 | Ht 63.0 in | Wt 154.0 lb

## 2024-06-24 DIAGNOSIS — I340 Nonrheumatic mitral (valve) insufficiency: Secondary | ICD-10-CM

## 2024-06-24 DIAGNOSIS — R0989 Other specified symptoms and signs involving the circulatory and respiratory systems: Secondary | ICD-10-CM | POA: Diagnosis not present

## 2024-06-24 DIAGNOSIS — Z136 Encounter for screening for cardiovascular disorders: Secondary | ICD-10-CM

## 2024-06-24 DIAGNOSIS — I700 Atherosclerosis of aorta: Secondary | ICD-10-CM | POA: Diagnosis not present

## 2024-06-24 MED ORDER — PRAVASTATIN SODIUM 20 MG PO TABS: 20.0000 mg | ORAL_TABLET | Freq: Every evening | ORAL | 3 refills | Status: AC

## 2024-06-24 NOTE — Patient Instructions (Signed)
 Medication Instructions:  Your physician has recommended you make the following change in your medication:   -Start Pravastatin  20 mg once daily   *If you need a refill on your cardiac medications before your next appointment, please call your pharmacy*  Lab Work: NOne If you have labs (blood work) drawn today and your tests are completely normal, you will receive your results only by: MyChart Message (if you have MyChart) OR A paper copy in the mail If you have any lab test that is abnormal or we need to change your treatment, we will call you to review the results.  Testing/Procedures: Your physician has requested that you have a carotid duplex. This test is an ultrasound of the carotid arteries in your neck. It looks at blood flow through these arteries that supply the brain with blood. Allow one hour for this exam. There are no restrictions or special instructions.   Follow-Up: At [facility], you and your health needs are our priority.  As part of our continuing mission to provide you with exceptional heart care, our providers are all part of one team.  This team includes your primary Cardiologist (physician) and Advanced Practice Providers or APPs (Physician Assistants and Nurse Practitioners) who all work together to provide you with the care you need, when you need it.  Your next appointment:   6 month(s)  Provider:   You may see Dorn Ross, MD or one of the following Advanced Practice Providers on your designated Care Team:   Laymon Qua, PA-C  [facility] [place], [place] Olivia Pavy, [place]     We recommend signing up for the patient portal called MyChart.  Sign up information is provided on this After Visit Summary.  MyChart is used to connect with patients for Virtual Visits (Telemedicine).  Patients are able to view lab/test results, encounter notes, upcoming appointments, etc.  Non-urgent messages can be sent to your provider as well.   To learn more about  what you can do with MyChart, go to [email or web address].   Other Instructions

## 2024-06-24 NOTE — Progress Notes (Signed)
 "     Clinical Summary Margaret York is a 69 y.o.female seen today as a new consult, referred by PA Dow for the following medical problems.   1.Mitral regurgitatin - 03/2024 echo [facility]: LVEF 65-70%, mild LVH, grade I dd, mild MR   2.Aortic atherosclerosis - noted by 03/2024 CT [facility] at [facility]  3. Abdominal aorta dissection 03/2024 CT: VASCULAR:  Fusiform ectasia of the abdominal aorta up to 2.3 cm. Severe atherosclerosis of the abdominal aorta and its branches, including chronic dissection of the infrarenal segment.   - no claudication - Smoker x 50years, HLD -tried statin several years ago, stopped she is not sure why - 02/2024 TC 116 TG 172 HDL 28 LDL 66    Past Medical History:  Diagnosis Date   Anxiety    Diverticulitis large intestine    HTN (hypertension)    Hyperlipidemia    Situational depression      Allergies[1]   Current Outpatient Medications  Medication Sig Dispense Refill   aspirin EC 81 MG tablet Take 81 mg by mouth daily. Swallow whole.     B Complex-C (SUPER B COMPLEX PO) Take 1 tablet by mouth daily.     Collagen-Vitamin C (SUPER COLLAGEN PLUS VITAMIN C PO) Take 1 capsule by mouth in the morning and at bedtime.     diphenhydrAMINE (BENADRYL) 25 MG tablet Take 25 mg by mouth 2 (two) times daily as needed for allergies.     loratadine (CLARITIN) 10 MG tablet Take 10 mg by mouth daily.     Multiple Vitamin (MULTIVITAMIN WITH MINERALS) TABS tablet Take 1 tablet by mouth daily.     Omega-3 Fatty Acids (FISH OIL) 1000 MG CAPS Take 2,000 mg by mouth in the morning and at bedtime.     pravastatin  (PRAVACHOL ) 20 MG tablet Take 1 tablet (20 mg total) by mouth every evening. 90 tablet 3   escitalopram (LEXAPRO) 10 MG tablet Take 10 mg by mouth daily. (Patient not taking: Reported on 06/24/2024)     No current facility-administered medications for this visit.     Past Surgical History:  Procedure Laterality Date   BREAST SURGERY Right    benign   BUNIONECTOMY  Bilateral    CATARACT EXTRACTION W/PHACO Left 01/01/2021   Procedure: CATARACT EXTRACTION PHACO AND INTRAOCULAR LENS PLACEMENT (IOC);  Surgeon: Harrie Agent, MD;  Location: [facility] ORS;  Service: Ophthalmology;  Laterality: Left;  CDE   6.23   CATARACT EXTRACTION W/PHACO Right 01/27/2021   Procedure: CATARACT EXTRACTION PHACO AND INTRAOCULAR LENS PLACEMENT (IOC);  Surgeon: Harrie Agent, MD;  Location: [facility] ORS;  Service: Ophthalmology;  Laterality: Right;  CDE 7.25   FLEXIBLE SIGMOIDOSCOPY N/A 05/09/2024   Procedure: KINGSTON SIDE;  Surgeon: Shaaron Lamar HERO, MD;  Location: [facility] ENDO SUITE;  Service: Endoscopy;  Laterality: N/A;   JOINT REPLACEMENT     2nd toe left foot   TUBAL LIGATION       Allergies[2]    Family History  Problem Relation Age of Onset   Colon cancer Father        70s     Social History Margaret York reports that she has been smoking cigarettes. She started smoking about 54 years ago. She has a 27 pack-year smoking history. She has never used smokeless tobacco. Margaret York reports that she does not currently use alcohol.    Physical Examination Today's Vitals   06/24/24 1036 06/24/24 1119  BP: (!) 144/84 132/82  Pulse: 84   SpO2: 96%  Weight: 154 lb (69.9 kg)   Height: 5' 3 (1.6 m)   PainSc: 0-No pain    Body mass index is 27.28 kg/m.  Gen: resting comfortably, no acute distress HEENT: no scleral icterus, pupils equal York and reactive, no palptable cervical adenopathy,  CV: RRR, no mrg, 2/6 systolic murmur apex. Right carotid bruit Resp: Clear to auscultation bilaterally GI: abdomen is soft, non-tender, non-distended, normal bowel sounds, no hepatosplenomegaly MSK: extremities are warm, no edema.  Skin: warm, no rash Neuro:  no focal deficits Psych: appropriate affect    Assessment and Plan  1.Mitral regurgtation - mild by echo, repeat echo 3-5 years  2. Carotid bruit - order carotid US   3. Aortic atherosclerosis - noted on CT  A/P at [facility], she is on ASA. Will start statin though her LDL is in the 60s would benefit from the pleiotropic effects - discuss with vascular the chronic abdominal aorta dissection if anything additional to do  EKG today shows NSR, LAFB   Addendum: reached out to Dr Sheree vascular, from report dissection looks chronic. Recs to repeat US  in 1-2 years, they can see if needed. We will plan to repeat US  in 1 year.    Dorn PHEBE Ross, M.D.     [1]  Allergies Allergen Reactions   Sulfa Antibiotics Hives   Codeine Nausea Only   Zyban [Bupropion]     Increases blood pressure   Bactrim [Sulfamethoxazole-Trimethoprim] Hives and Rash  [2]  Allergies Allergen Reactions   Sulfa Antibiotics Hives   Codeine Nausea Only   Zyban [Bupropion]     Increases blood pressure   Bactrim [Sulfamethoxazole-Trimethoprim] Hives and Rash   "

## 2024-06-27 ENCOUNTER — Ambulatory Visit (HOSPITAL_COMMUNITY)
Admission: RE | Admit: 2024-06-27 | Discharge: 2024-06-27 | Disposition: A | Discharge status: Home / Self Care | Source: Ambulatory Visit | Attending: Cardiology | Admitting: Cardiology

## 2024-06-27 DIAGNOSIS — R0989 Other specified symptoms and signs involving the circulatory and respiratory systems: Secondary | ICD-10-CM | POA: Insufficient documentation

## 2024-07-02 ENCOUNTER — Ambulatory Visit: Payer: Self-pay | Admitting: Cardiology

## 2024-07-03 NOTE — Telephone Encounter (Signed)
-----   Message from Dorn Ross, MD sent at 07/02/2024  4:57 PM EST ----- Very mild plaque in the arteries of the neck, just something we will continue to monitor  JINNY Ross MD

## 2024-07-03 NOTE — Telephone Encounter (Signed)
 Notified via my chart.  Copy to pcp.
